# Patient Record
Sex: Female | Born: 1955 | Race: White | Hispanic: No | Marital: Married | Smoking: Never smoker
Health system: Southern US, Community
[De-identification: ages and names within clinical notes are randomized; demographics above are authoritative.]

## PROBLEM LIST (undated history)

## (undated) DIAGNOSIS — I7789 Other specified disorders of arteries and arterioles: Secondary | ICD-10-CM

## (undated) DIAGNOSIS — J45909 Unspecified asthma, uncomplicated: Secondary | ICD-10-CM

## (undated) DIAGNOSIS — IMO0002 Reserved for concepts with insufficient information to code with codable children: Secondary | ICD-10-CM

## (undated) DIAGNOSIS — I10 Essential (primary) hypertension: Secondary | ICD-10-CM

## (undated) DIAGNOSIS — Z87898 Personal history of other specified conditions: Secondary | ICD-10-CM

## (undated) DIAGNOSIS — E785 Hyperlipidemia, unspecified: Secondary | ICD-10-CM

## (undated) DIAGNOSIS — A389 Scarlet fever, uncomplicated: Secondary | ICD-10-CM

## (undated) HISTORY — DX: Unspecified asthma, uncomplicated: J45.909

## (undated) HISTORY — PX: WISDOM TOOTH EXTRACTION: SHX21

## (undated) HISTORY — PX: APPENDECTOMY: SHX54

## (undated) HISTORY — PX: COLONOSCOPY: SHX174

## (undated) HISTORY — DX: Personal history of other specified conditions: Z87.898

## (undated) HISTORY — DX: Hyperlipidemia, unspecified: E78.5

## (undated) HISTORY — PX: EYE SURGERY: SHX253

## (undated) HISTORY — DX: Essential (primary) hypertension: I10

## (undated) HISTORY — DX: Other specified disorders of arteries and arterioles: I77.89

## (undated) HISTORY — DX: Scarlet fever, uncomplicated: A38.9

## (undated) HISTORY — DX: Reserved for concepts with insufficient information to code with codable children: IMO0002

## (undated) HISTORY — PX: KNEE SURGERY: SHX244

---

## 1982-01-22 HISTORY — PX: GYNECOLOGIC CRYOSURGERY: SHX857

## 2003-01-23 HISTORY — PX: BLADDER SURGERY: SHX569

## 2012-07-01 ENCOUNTER — Telehealth: Payer: Self-pay | Admitting: Obstetrics and Gynecology

## 2012-07-01 NOTE — Telephone Encounter (Signed)
Patient needs mammograms schedule at [facility] [phone]

## 2012-07-01 NOTE — Telephone Encounter (Signed)
Left message for patient to return call concerning mammogram to be scheduled. sue

## 2012-07-02 NOTE — Telephone Encounter (Signed)
Patient states she was calling to let us know that she had scheduled her mammogram at [facility] and only needed the doctor's name who she saw last so that the report can be sent here to our office. Patient states was told they did not need an order from the doctor as long as she was not having any problems. sue

## 2012-07-02 NOTE — Telephone Encounter (Signed)
Yes patient aware of Dr. Salena Saner. Romine.

## 2012-07-02 NOTE — Telephone Encounter (Signed)
Left message on VM at [facility]/ [phone]/ of need to send fax form for referral to have patient mammogram done there per her request.

## 2012-07-02 NOTE — Telephone Encounter (Signed)
It was Dr. Tresa Res.  Did you let pt know that?

## 2012-12-09 ENCOUNTER — Ambulatory Visit: Payer: Self-pay | Admitting: Gynecology

## 2012-12-09 ENCOUNTER — Ambulatory Visit: Payer: Self-pay | Admitting: Obstetrics and Gynecology

## 2012-12-23 ENCOUNTER — Encounter: Payer: Self-pay | Admitting: Obstetrics & Gynecology

## 2012-12-25 ENCOUNTER — Encounter: Payer: Self-pay | Admitting: Obstetrics & Gynecology

## 2012-12-25 ENCOUNTER — Ambulatory Visit (INDEPENDENT_AMBULATORY_CARE_PROVIDER_SITE_OTHER): Payer: BC Managed Care – PPO | Admitting: Obstetrics & Gynecology

## 2012-12-25 VITALS — BP 130/80 | HR 72 | Resp 20 | Ht 64.75 in | Wt 134.0 lb

## 2012-12-25 DIAGNOSIS — Z01419 Encounter for gynecological examination (general) (routine) without abnormal findings: Secondary | ICD-10-CM

## 2012-12-25 DIAGNOSIS — Z124 Encounter for screening for malignant neoplasm of cervix: Secondary | ICD-10-CM

## 2012-12-25 NOTE — Patient Instructions (Signed)
Osphena
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
## 2012-12-26 ENCOUNTER — Telehealth: Payer: Self-pay | Admitting: Obstetrics & Gynecology

## 2012-12-26 NOTE — Telephone Encounter (Addendum)
I left a message for patient to call with the name and phone number of Doctor who ordered her recent labs.  I will need to get labs from the ordering Doctor. The patient had signed a medical release to [facility].

## 2012-12-30 LAB — IPS PAP TEST WITH HPV

## 2013-11-23 ENCOUNTER — Encounter: Payer: Self-pay | Admitting: Obstetrics & Gynecology

## 2014-01-26 ENCOUNTER — Encounter: Payer: Self-pay | Admitting: Obstetrics & Gynecology

## 2014-01-26 ENCOUNTER — Ambulatory Visit (INDEPENDENT_AMBULATORY_CARE_PROVIDER_SITE_OTHER): Payer: BLUE CROSS/BLUE SHIELD | Admitting: Obstetrics & Gynecology

## 2014-01-26 VITALS — BP 144/82 | HR 60 | Resp 16 | Ht 65.0 in | Wt 132.6 lb

## 2014-01-26 DIAGNOSIS — Z0000 Encounter for general adult medical examination without abnormal findings: Secondary | ICD-10-CM

## 2014-01-26 DIAGNOSIS — Z01419 Encounter for gynecological examination (general) (routine) without abnormal findings: Secondary | ICD-10-CM

## 2014-01-26 LAB — POCT URINALYSIS DIPSTICK
BILIRUBIN UA: NEGATIVE
Blood, UA: NEGATIVE
GLUCOSE UA: NEGATIVE
Ketones, UA: NEGATIVE
LEUKOCYTES UA: NEGATIVE
NITRITE UA: NEGATIVE
Protein, UA: NEGATIVE
UROBILINOGEN UA: NEGATIVE
pH, UA: 5
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
## 2015-03-22 ENCOUNTER — Ambulatory Visit: Payer: BLUE CROSS/BLUE SHIELD | Admitting: Obstetrics & Gynecology

## 2015-11-18 ENCOUNTER — Encounter: Payer: Self-pay | Admitting: Obstetrics & Gynecology

## 2015-11-18 ENCOUNTER — Ambulatory Visit (INDEPENDENT_AMBULATORY_CARE_PROVIDER_SITE_OTHER): Payer: BLUE CROSS/BLUE SHIELD | Admitting: Obstetrics & Gynecology

## 2015-11-18 VITALS — BP 110/62 | HR 72 | Resp 14 | Ht 65.0 in | Wt 143.4 lb

## 2015-11-18 DIAGNOSIS — Z01419 Encounter for gynecological examination (general) (routine) without abnormal findings: Secondary | ICD-10-CM | POA: Diagnosis not present

## 2015-11-18 DIAGNOSIS — I10 Essential (primary) hypertension: Secondary | ICD-10-CM | POA: Diagnosis not present

## 2015-11-18 DIAGNOSIS — Z205 Contact with and (suspected) exposure to viral hepatitis: Secondary | ICD-10-CM | POA: Diagnosis not present

## 2015-11-18 DIAGNOSIS — Z124 Encounter for screening for malignant neoplasm of cervix: Secondary | ICD-10-CM

## 2015-11-18 NOTE — Patient Instructions (Addendum)
Recommendations regarding Calcium intake:  1200-1500mg  daily.  Os cal or caltrate.
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
## 2015-11-18 NOTE — Addendum Note (Signed)
Addended by: Jerene BearsMILLER, Ruchama Kubicek S on: 11/18/2015 04:23 PM   Modules accepted: Orders

## 2015-11-19 LAB — HEPATITIS C ANTIBODY: HCV Ab: NEGATIVE

## 2015-11-22 LAB — IPS PAP TEST WITH HPV

## 2016-11-26 DIAGNOSIS — I10 Essential (primary) hypertension: Secondary | ICD-10-CM | POA: Diagnosis not present

## 2016-11-26 DIAGNOSIS — Z8249 Family history of ischemic heart disease and other diseases of the circulatory system: Secondary | ICD-10-CM | POA: Diagnosis not present

## 2016-11-26 DIAGNOSIS — I77819 Aortic ectasia, unspecified site: Secondary | ICD-10-CM | POA: Diagnosis not present

## 2016-11-28 DIAGNOSIS — R351 Nocturia: Secondary | ICD-10-CM | POA: Diagnosis not present

## 2016-11-28 DIAGNOSIS — R350 Frequency of micturition: Secondary | ICD-10-CM | POA: Diagnosis not present

## 2016-12-04 ENCOUNTER — Telehealth: Payer: Self-pay | Admitting: Obstetrics & Gynecology

## 2016-12-04 NOTE — Telephone Encounter (Signed)
Return call to patient. States she had appointment with Dr Sherron MondayMacDiarmid and needs surgery for bladder prolapse. Hysterectomy recommended at same time. Was told may be able to be done soon. Advised will first need office visit with Dr Hyacinth MeekerMiller for evaluation. Advised dates are limited and scheduling does take time.   Appointment with Dr Hyacinth MeekerMiller scheduled for 12-07-16 at 1pm. Patient last seen for annual 11-18-15.   Routing to provider for final review. Patient agreeable to disposition. Will close encounter.

## 2016-12-04 NOTE — Telephone Encounter (Signed)
Patient saw her urologist and he is recommending she speak to Dr Hyacinth MeekerMiller about having a hysterectomy at the same time that he does a bladder procedure.

## 2016-12-07 ENCOUNTER — Encounter: Payer: Self-pay | Admitting: Obstetrics & Gynecology

## 2016-12-07 ENCOUNTER — Ambulatory Visit (INDEPENDENT_AMBULATORY_CARE_PROVIDER_SITE_OTHER): Payer: BLUE CROSS/BLUE SHIELD | Admitting: Obstetrics & Gynecology

## 2016-12-07 ENCOUNTER — Other Ambulatory Visit: Payer: Self-pay

## 2016-12-07 VITALS — BP 140/60 | HR 68 | Resp 16 | Ht 65.0 in | Wt 141.0 lb

## 2016-12-07 DIAGNOSIS — N8111 Cystocele, midline: Secondary | ICD-10-CM | POA: Diagnosis not present

## 2016-12-07 DIAGNOSIS — N812 Incomplete uterovaginal prolapse: Secondary | ICD-10-CM
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
## 2016-12-19 ENCOUNTER — Telehealth: Payer: Self-pay | Admitting: *Deleted

## 2016-12-19 NOTE — Telephone Encounter (Signed)
Patient returning your call.

## 2016-12-19 NOTE — Telephone Encounter (Signed)
Call to patient. Voice mail confirms "Jeanne Livingston." Left message to call back. Call to advise patient that Dr Hyacinth MeekerMiller has reviewed office visit with Dr Sherron MondayMacDiarmid. No available dates in December. Can begin scheduling for 2019 when she is ready.

## 2016-12-19 NOTE — Telephone Encounter (Signed)
Return call to patient. Advised of date updates from Dr Hyacinth MeekerMiller. Patient requests to know what dates in 2019 are available and then she will decide. Will confirm with Dr Sherron MondayMacDiarmid and call her back.

## 2016-12-20 NOTE — Telephone Encounter (Signed)
Call to patient. Advised surgery dates with Dr MacDiarmid's office are 02-12-17, 03-05-17 and 03-19-17. Patient will check these dates and call me back.

## 2016-12-21 NOTE — Telephone Encounter (Signed)
Current encounter closed. Will await patient call back with date preferences.  Routing to provider for final review. Patient agreeable to disposition. Will close encounter.

## 2016-12-24 ENCOUNTER — Telehealth: Payer: Self-pay | Admitting: Obstetrics & Gynecology

## 2016-12-24 NOTE — Telephone Encounter (Signed)
Return call to patient. She requests to proceed with surgery date of 03-05-17. Advised will proceed with scheduling and coordinate with Dr Sherron MondayMacDiarmid. Advised patient she will need to schudele her urodynamic testing with his office. Patient has additional questions regarding ability to void prior to discharge since she lives in [place]. Advised that she will be scheduled for out-patient with extended recovery and if additional time is needed that will be determined at time of hospitalization. Advised Dr Sherron MondayMacDiarmid may have more thoughts on this.

## 2016-12-24 NOTE — Telephone Encounter (Signed)
Patient returning Jeanne Livingston's call to discuss surgery dates.

## 2017-01-03 NOTE — Telephone Encounter (Signed)
Patient returned call. Surgery information sheet reviewed with patient and she verbalized understanding. Pre and post operative appointments scheduled and patient agreeable to date and time of all appointments. Patient aware a copy of surgery information sheet will be mailed to her. Patient verified "temporary address" on file is where information needed to be mailed.   Routing to provider for final review. Patient agreeable to disposition. Will close encounter.

## 2017-01-03 NOTE — Telephone Encounter (Signed)
Detailed message left per DPR for patient to return call to Jeanne BurtonEmily or Kennon RoundsSally to review Surgery Information sheet, and to schedule pre and post op appointments.

## 2017-01-09 ENCOUNTER — Other Ambulatory Visit: Payer: Self-pay | Admitting: Obstetrics & Gynecology

## 2017-02-06 DIAGNOSIS — H2513 Age-related nuclear cataract, bilateral: Secondary | ICD-10-CM | POA: Diagnosis not present

## 2017-02-06 DIAGNOSIS — H524 Presbyopia: Secondary | ICD-10-CM | POA: Diagnosis not present

## 2017-02-06 DIAGNOSIS — H43393 Other vitreous opacities, bilateral: Secondary | ICD-10-CM | POA: Diagnosis not present

## 2017-02-13 DIAGNOSIS — R351 Nocturia: Secondary | ICD-10-CM | POA: Diagnosis not present

## 2017-02-13 DIAGNOSIS — R350 Frequency of micturition: Secondary | ICD-10-CM | POA: Diagnosis not present

## 2017-02-19 NOTE — Patient Instructions (Addendum)
Your procedure is scheduled on:  Tuesday, Feb 12  [facility]  6 am  Pick up the phone at the desk and dial [phone].  Call this number if you have problems the morning of surgery: [phone].  Remember: Do NOT eat or Do NOT drink clear liquids (including water) after midnight Monday  Take these medicines the morning of surgery with a SIP OF WATER:  None  Stop herbal medications and supplements at this time.  Do NOT wear jewelry (body piercing), metal hair clips/bobby pins, make-up, or nail polish. Do NOT wear lotions, powders, or perfumes.  You may wear deoderant. Do NOT shave for 48 hours prior to surgery. Do NOT bring valuables to the hospital.  Leave suitcase in car.  After surgery it may be brought to your room.  For patients admitted to the hospital, checkout time is 11:00 AM the day of discharge. Have a responsible adult drive you home and stay with you for 24 hours after your procedure.  Home with husband "Nadine CountsBob" cell [phone]

## 2017-02-21 ENCOUNTER — Other Ambulatory Visit: Payer: Self-pay | Admitting: Urology
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
## 2017-02-22 ENCOUNTER — Other Ambulatory Visit: Payer: Self-pay

## 2017-02-22 ENCOUNTER — Ambulatory Visit (INDEPENDENT_AMBULATORY_CARE_PROVIDER_SITE_OTHER): Payer: BLUE CROSS/BLUE SHIELD | Admitting: Obstetrics & Gynecology

## 2017-02-22 ENCOUNTER — Encounter (HOSPITAL_COMMUNITY)
Admission: RE | Admit: 2017-02-22 | Discharge: 2017-02-22 | Disposition: A | Discharge status: Home / Self Care | Payer: BLUE CROSS/BLUE SHIELD | Source: Ambulatory Visit | Attending: Obstetrics & Gynecology | Admitting: Obstetrics & Gynecology

## 2017-02-22 ENCOUNTER — Encounter: Payer: Self-pay | Admitting: Obstetrics & Gynecology

## 2017-02-22 ENCOUNTER — Encounter (HOSPITAL_COMMUNITY): Payer: Self-pay

## 2017-02-22 VITALS — BP 142/64 | HR 64 | Resp 12 | Ht 64.5 in | Wt 143.0 lb

## 2017-02-22 DIAGNOSIS — Z9889 Other specified postprocedural states: Secondary | ICD-10-CM | POA: Diagnosis not present

## 2017-02-22 DIAGNOSIS — J45990 Exercise induced bronchospasm: Secondary | ICD-10-CM | POA: Diagnosis not present

## 2017-02-22 DIAGNOSIS — N812 Incomplete uterovaginal prolapse: Secondary | ICD-10-CM

## 2017-02-22 DIAGNOSIS — E785 Hyperlipidemia, unspecified: Secondary | ICD-10-CM | POA: Insufficient documentation

## 2017-02-22 DIAGNOSIS — Z79899 Other long term (current) drug therapy: Secondary | ICD-10-CM | POA: Insufficient documentation

## 2017-02-22 DIAGNOSIS — I10 Essential (primary) hypertension: Secondary | ICD-10-CM | POA: Insufficient documentation

## 2017-02-22 DIAGNOSIS — N959 Unspecified menopausal and perimenopausal disorder: Secondary | ICD-10-CM | POA: Diagnosis not present

## 2017-02-22 DIAGNOSIS — Z01812 Encounter for preprocedural laboratory examination: Secondary | ICD-10-CM | POA: Insufficient documentation

## 2017-02-22 DIAGNOSIS — Z791 Long term (current) use of non-steroidal anti-inflammatories (NSAID): Secondary | ICD-10-CM | POA: Insufficient documentation

## 2017-02-22 DIAGNOSIS — I7789 Other specified disorders of arteries and arterioles: Secondary | ICD-10-CM | POA: Diagnosis not present

## 2017-02-22 DIAGNOSIS — N8111 Cystocele, midline: Secondary | ICD-10-CM | POA: Diagnosis not present

## 2017-02-22 LAB — ABO/RH: ABO/RH(D): A POS

## 2017-02-22 LAB — TYPE AND SCREEN
ABO/RH(D): A POS
ANTIBODY SCREEN: NEGATIVE

## 2017-02-22 LAB — CBC
HEMATOCRIT: 39.5 % (ref 36.0–46.0)
HEMOGLOBIN: 13.3 g/dL (ref 12.0–15.0)
MCH: 31.9 pg (ref 26.0–34.0)
MCHC: 33.7 g/dL (ref 30.0–36.0)
MCV: 94.7 fL (ref 78.0–100.0)
Platelets: 255 10*3/uL (ref 150–400)
RBC: 4.17 MIL/uL (ref 3.87–5.11)
RDW: 12.5 % (ref 11.5–15.5)
WBC: 6 10*3/uL (ref 4.0–10.5)

## 2017-02-22 LAB — APTT: aPTT: 28 seconds (ref 24–36)

## 2017-02-22 LAB — PROTIME-INR
INR: 1.09
PROTHROMBIN TIME: 14 s (ref 11.4–15.2)

## 2017-02-22 LAB — BASIC METABOLIC PANEL
ANION GAP: 9 (ref 5–15)
BUN: 18 mg/dL (ref 6–20)
CHLORIDE: 104 mmol/L (ref 101–111)
CO2: 24 mmol/L (ref 22–32)
Calcium: 9.1 mg/dL (ref 8.9–10.3)
Creatinine, Ser: 0.77 mg/dL (ref 0.44–1.00)
GFR calc Af Amer: >60 mL/min (ref >60)
Glucose, Bld: 117 mg/dL — ABNORMAL HIGH (ref 65–99)
POTASSIUM: 3.8 mmol/L (ref 3.5–5.1)
SODIUM: 137 mmol/L (ref 135–145)

## 2017-02-26 ENCOUNTER — Telehealth: Payer: Self-pay | Admitting: *Deleted

## 2017-02-26 NOTE — Telephone Encounter (Signed)
Delray AltMargie is returning your call.

## 2017-02-26 NOTE — Telephone Encounter (Signed)
[facility].Left message to call back.

## 2017-02-26 NOTE — Telephone Encounter (Signed)
Call to Dr Okey Dupreose office [facility]) Left message with Darius advising Dr Okey Dupreose of upcoming surgical procedure and requesting any additional suggestions for patient management from Dr Okey Dupreose.  Left message to call back.

## 2017-02-26 NOTE — Telephone Encounter (Signed)
[facility] is calling to see if you are calling regarding cardiac clearness. Please return call to [phone]

## 2017-02-27 NOTE — Telephone Encounter (Signed)
[facility] at [facility]key Dupreose office. Left message to return call. Can speak to any triage nurse.

## 2017-02-27 NOTE — Telephone Encounter (Signed)
[facility]ose [facility]. She will review with Dr Okey Dupreose and fax any additional recommendations to office. Fax number confirmed.   Routing to provider for final review. Patient agreeable to disposition. Will close encounter.

## 2017-02-28 DIAGNOSIS — Z1231 Encounter for screening mammogram for malignant neoplasm of breast: Secondary | ICD-10-CM | POA: Diagnosis not present

## 2017-03-01 ENCOUNTER — Telehealth: Payer: Self-pay

## 2017-03-01 NOTE — Telephone Encounter (Signed)
Spoke with nurse French Anaracy with Dr.Rose who states she has spoken with Dr.Rose about the patient's upcoming surgery. Dr.Rose states that no additional monitoring or testing needs to be done before, during, or after surgery for this patient.  Routing to provider for final review. Patient agreeable to disposition. Will close encounter.

## 2017-03-05 ENCOUNTER — Encounter (HOSPITAL_COMMUNITY): Payer: Self-pay | Admitting: *Deleted

## 2017-03-05 ENCOUNTER — Ambulatory Visit (HOSPITAL_COMMUNITY): Payer: BLUE CROSS/BLUE SHIELD | Admitting: Anesthesiology

## 2017-03-05 ENCOUNTER — Other Ambulatory Visit: Payer: Self-pay

## 2017-03-05 ENCOUNTER — Ambulatory Visit (HOSPITAL_COMMUNITY)
Admission: AD | Admit: 2017-03-05 | Discharge: 2017-03-06 | Disposition: A | Discharge status: Home / Self Care | Payer: BLUE CROSS/BLUE SHIELD | Source: Ambulatory Visit | Attending: Obstetrics & Gynecology | Admitting: Obstetrics & Gynecology

## 2017-03-05 ENCOUNTER — Encounter (HOSPITAL_COMMUNITY)
Admission: AD | Disposition: A | Discharge status: Home / Self Care | Payer: Self-pay | Source: Ambulatory Visit | Attending: Obstetrics & Gynecology

## 2017-03-05 DIAGNOSIS — N888 Other specified noninflammatory disorders of cervix uteri: Secondary | ICD-10-CM | POA: Diagnosis not present

## 2017-03-05 DIAGNOSIS — J45990 Exercise induced bronchospasm: Secondary | ICD-10-CM | POA: Insufficient documentation

## 2017-03-05 DIAGNOSIS — N813 Complete uterovaginal prolapse: Secondary | ICD-10-CM | POA: Insufficient documentation

## 2017-03-05 DIAGNOSIS — Z8679 Personal history of other diseases of the circulatory system: Secondary | ICD-10-CM | POA: Diagnosis not present

## 2017-03-05 DIAGNOSIS — N816 Rectocele: Secondary | ICD-10-CM | POA: Insufficient documentation

## 2017-03-05 DIAGNOSIS — Z91038 Other insect allergy status: Secondary | ICD-10-CM | POA: Insufficient documentation

## 2017-03-05 DIAGNOSIS — Z79899 Other long term (current) drug therapy: Secondary | ICD-10-CM | POA: Insufficient documentation

## 2017-03-05 DIAGNOSIS — Z885 Allergy status to narcotic agent status: Secondary | ICD-10-CM | POA: Diagnosis not present

## 2017-03-05 DIAGNOSIS — Z882 Allergy status to sulfonamides status: Secondary | ICD-10-CM | POA: Insufficient documentation

## 2017-03-05 DIAGNOSIS — N8110 Cystocele, unspecified: Secondary | ICD-10-CM | POA: Diagnosis not present

## 2017-03-05 DIAGNOSIS — N812 Incomplete uterovaginal prolapse: Secondary | ICD-10-CM | POA: Diagnosis not present

## 2017-03-05 DIAGNOSIS — N8111 Cystocele, midline: Secondary | ICD-10-CM | POA: Diagnosis not present

## 2017-03-05 DIAGNOSIS — E785 Hyperlipidemia, unspecified: Secondary | ICD-10-CM | POA: Diagnosis not present

## 2017-03-05 DIAGNOSIS — I10 Essential (primary) hypertension: Secondary | ICD-10-CM | POA: Insufficient documentation

## 2017-03-05 DIAGNOSIS — Z91030 Bee allergy status: Secondary | ICD-10-CM | POA: Diagnosis not present

## 2017-03-05 HISTORY — PX: TOTAL LAPAROSCOPIC HYSTERECTOMY WITH SALPINGECTOMY: SHX6742

## 2017-03-05 HISTORY — PX: CYSTOSCOPY: SHX5120

## 2017-03-05 HISTORY — PX: CYSTOCELE REPAIR: SHX163

## 2017-03-05 LAB — PROTIME-INR
INR: 0.99
PROTHROMBIN TIME: 13 s (ref 11.4–15.2)

## 2017-03-05 SURGERY — HYSTERECTOMY, TOTAL, LAPAROSCOPIC, WITH SALPINGECTOMY
Anesthesia: General | Site: Vagina

## 2017-03-05 MED ORDER — DEXAMETHASONE SODIUM PHOSPHATE 4 MG/ML IJ SOLN
INTRAMUSCULAR | Status: AC
  Filled 2017-03-05: qty 1

## 2017-03-05 MED ORDER — PHENAZOPYRIDINE HCL 200 MG PO TABS
200.0000 mg | ORAL_TABLET | Freq: Once | ORAL | Status: AC
  Administered 2017-03-05: 200 mg via ORAL
  Filled 2017-03-05: qty 1

## 2017-03-05 MED ORDER — MIDAZOLAM HCL 2 MG/2ML IJ SOLN
INTRAMUSCULAR | Status: DC | PRN
  Administered 2017-03-05: 2 mg via INTRAVENOUS

## 2017-03-05 MED ORDER — LIDOCAINE-EPINEPHRINE 1 %-1:200000 IJ SOLN
INTRAMUSCULAR | Status: AC
Start: 2017-03-05 — End: ?
  Filled 2017-03-05: qty 30

## 2017-03-05 MED ORDER — ESTRADIOL 0.1 MG/GM VA CREA
TOPICAL_CREAM | VAGINAL | Status: DC | PRN
  Administered 2017-03-05: 1 via VAGINAL

## 2017-03-05 MED ORDER — ACETAMINOPHEN 325 MG PO TABS: 650.0000 mg | ORAL_TABLET | ORAL | Status: DC | PRN

## 2017-03-05 MED ORDER — ACETAMINOPHEN 500 MG PO TABS
ORAL_TABLET | ORAL | Status: AC
  Filled 2017-03-05: qty 2

## 2017-03-05 MED ORDER — LACTATED RINGERS IV SOLN
INTRAVENOUS | Status: DC
  Administered 2017-03-05 (×3): via INTRAVENOUS

## 2017-03-05 MED ORDER — NEOSTIGMINE METHYLSULFATE 10 MG/10ML IV SOLN
INTRAVENOUS | Status: AC
  Filled 2017-03-05: qty 1

## 2017-03-05 MED ORDER — ONDANSETRON HCL 4 MG/2ML IJ SOLN
INTRAMUSCULAR | Status: AC
  Filled 2017-03-05: qty 2

## 2017-03-05 MED ORDER — SUGAMMADEX SODIUM 200 MG/2ML IV SOLN
INTRAVENOUS | Status: AC
  Filled 2017-03-05: qty 2

## 2017-03-05 MED ORDER — KETOROLAC TROMETHAMINE 30 MG/ML IJ SOLN
INTRAMUSCULAR | Status: AC
  Filled 2017-03-05: qty 1

## 2017-03-05 MED ORDER — ROPIVACAINE HCL 5 MG/ML IJ SOLN
INTRAMUSCULAR | Status: AC
  Filled 2017-03-05: qty 30

## 2017-03-05 MED ORDER — LIDOCAINE HCL (CARDIAC) 20 MG/ML IV SOLN
INTRAVENOUS | Status: AC
  Filled 2017-03-05: qty 5

## 2017-03-05 MED ORDER — ALUM & MAG HYDROXIDE-SIMETH 200-200-20 MG/5ML PO SUSP: 30.0000 mL | ORAL | Status: DC | PRN

## 2017-03-05 MED ORDER — SODIUM CHLORIDE 0.9 % IJ SOLN
INTRAMUSCULAR | Status: AC
  Filled 2017-03-05: qty 100

## 2017-03-05 MED ORDER — CEFOTETAN DISODIUM-DEXTROSE 2-2.08 GM-%(50ML) IV SOLR
2.0000 g | INTRAVENOUS | Status: AC
  Administered 2017-03-05: 2 g via INTRAVENOUS

## 2017-03-05 MED ORDER — DEXAMETHASONE SODIUM PHOSPHATE 4 MG/ML IJ SOLN
INTRAMUSCULAR | Status: DC | PRN
  Administered 2017-03-05: 4 mg via INTRAVENOUS

## 2017-03-05 MED ORDER — KETOROLAC TROMETHAMINE 30 MG/ML IJ SOLN
INTRAMUSCULAR | Status: DC | PRN
  Administered 2017-03-05: 30 mg via INTRAVENOUS

## 2017-03-05 MED ORDER — GLYCOPYRROLATE 0.2 MG/ML IJ SOLN
INTRAMUSCULAR | Status: DC | PRN
  Administered 2017-03-05: 0.2 mg via INTRAVENOUS

## 2017-03-05 MED ORDER — SIMETHICONE 80 MG PO CHEW: 80.0000 mg | CHEWABLE_TABLET | Freq: Four times a day (QID) | ORAL | Status: DC | PRN

## 2017-03-05 MED ORDER — FENTANYL CITRATE (PF) 100 MCG/2ML IJ SOLN
INTRAMUSCULAR | Status: DC | PRN
  Administered 2017-03-05: 150 ug via INTRAVENOUS
  Administered 2017-03-05 (×2): 50 ug via INTRAVENOUS

## 2017-03-05 MED ORDER — NEOSTIGMINE METHYLSULFATE 10 MG/10ML IV SOLN
INTRAVENOUS | Status: DC | PRN
  Administered 2017-03-05: 2 mg via INTRAVENOUS

## 2017-03-05 MED ORDER — STERILE WATER FOR IRRIGATION IR SOLN
Status: DC | PRN
  Administered 2017-03-05: 1000 mL via INTRAVESICAL

## 2017-03-05 MED ORDER — LIDOCAINE-EPINEPHRINE 1 %-1:100000 IJ SOLN
INTRAMUSCULAR | Status: AC
  Filled 2017-03-05: qty 1

## 2017-03-05 MED ORDER — ACETAMINOPHEN 500 MG PO TABS
1000.0000 mg | ORAL_TABLET | Freq: Once | ORAL | Status: AC
  Administered 2017-03-05: 1000 mg via ORAL

## 2017-03-05 MED ORDER — FENTANYL CITRATE (PF) 100 MCG/2ML IJ SOLN: 25.0000 ug | INTRAMUSCULAR | Status: DC | PRN

## 2017-03-05 MED ORDER — MORPHINE SULFATE (PF) 4 MG/ML IV SOLN: 1.0000 mg | INTRAVENOUS | Status: DC | PRN

## 2017-03-05 MED ORDER — ROCURONIUM BROMIDE 100 MG/10ML IV SOLN
INTRAVENOUS | Status: AC
  Filled 2017-03-05: qty 1

## 2017-03-05 MED ORDER — MIDAZOLAM HCL 2 MG/2ML IJ SOLN
INTRAMUSCULAR | Status: AC
  Filled 2017-03-05: qty 2

## 2017-03-05 MED ORDER — PHENAZOPYRIDINE HCL 200 MG PO TABS
200.0000 mg | ORAL_TABLET | Freq: Once | ORAL | Status: DC
  Filled 2017-03-05: qty 1

## 2017-03-05 MED ORDER — SODIUM CHLORIDE 0.9 % IJ SOLN
INTRAMUSCULAR | Status: AC
  Filled 2017-03-05: qty 10

## 2017-03-05 MED ORDER — LIDOCAINE-EPINEPHRINE 1 %-1:100000 IJ SOLN
INTRAMUSCULAR | Status: DC | PRN
  Administered 2017-03-05: 20 mL

## 2017-03-05 MED ORDER — CEFOTETAN DISODIUM-DEXTROSE 2-2.08 GM-%(50ML) IV SOLR
INTRAVENOUS | Status: AC
  Filled 2017-03-05: qty 50

## 2017-03-05 MED ORDER — PANTOPRAZOLE SODIUM 40 MG IV SOLR
40.0000 mg | Freq: Every day | INTRAVENOUS | Status: DC
  Filled 2017-03-05 (×2): qty 40

## 2017-03-05 MED ORDER — KETOROLAC TROMETHAMINE 30 MG/ML IJ SOLN
30.0000 mg | Freq: Four times a day (QID) | INTRAMUSCULAR | Status: DC
  Administered 2017-03-05 – 2017-03-06 (×3): 30 mg via INTRAVENOUS
  Filled 2017-03-05 (×2): qty 1

## 2017-03-05 MED ORDER — HYDROMORPHONE HCL 1 MG/ML IJ SOLN
INTRAMUSCULAR | Status: AC
  Filled 2017-03-05: qty 1

## 2017-03-05 MED ORDER — MENTHOL 3 MG MT LOZG: 1.0000 | LOZENGE | OROMUCOSAL | Status: DC | PRN

## 2017-03-05 MED ORDER — ONDANSETRON HCL 4 MG/2ML IJ SOLN: 4.0000 mg | Freq: Once | INTRAMUSCULAR | Status: DC | PRN

## 2017-03-05 MED ORDER — PROPOFOL 10 MG/ML IV BOLUS
INTRAVENOUS | Status: AC
  Filled 2017-03-05: qty 20

## 2017-03-05 MED ORDER — DEXTROSE-NACL 5-0.45 % IV SOLN
INTRAVENOUS | Status: DC
  Administered 2017-03-05 – 2017-03-06 (×2): via INTRAVENOUS

## 2017-03-05 MED ORDER — EPHEDRINE SULFATE 50 MG/ML IJ SOLN
INTRAMUSCULAR | Status: DC | PRN
  Administered 2017-03-05 (×4): 5 mg via INTRAVENOUS

## 2017-03-05 MED ORDER — FENTANYL CITRATE (PF) 250 MCG/5ML IJ SOLN
INTRAMUSCULAR | Status: AC
  Filled 2017-03-05: qty 5

## 2017-03-05 MED ORDER — ESTRADIOL 0.1 MG/GM VA CREA
TOPICAL_CREAM | VAGINAL | Status: AC
  Filled 2017-03-05: qty 42.5

## 2017-03-05 MED ORDER — SCOPOLAMINE 1 MG/3DAYS TD PT72
1.0000 | MEDICATED_PATCH | Freq: Once | TRANSDERMAL | Status: DC
  Administered 2017-03-05: 1.5 mg via TRANSDERMAL

## 2017-03-05 MED ORDER — BUPIVACAINE HCL (PF) 0.25 % IJ SOLN
INTRAMUSCULAR | Status: AC
  Filled 2017-03-05: qty 30

## 2017-03-05 MED ORDER — SCOPOLAMINE 1 MG/3DAYS TD PT72
MEDICATED_PATCH | TRANSDERMAL | Status: AC
  Filled 2017-03-05: qty 1

## 2017-03-05 MED ORDER — GLYCOPYRROLATE 0.2 MG/ML IJ SOLN
INTRAMUSCULAR | Status: AC
  Filled 2017-03-05: qty 1

## 2017-03-05 MED ORDER — KETOROLAC TROMETHAMINE 30 MG/ML IJ SOLN
30.0000 mg | Freq: Four times a day (QID) | INTRAMUSCULAR | Status: DC
  Filled 2017-03-05: qty 1

## 2017-03-05 MED ORDER — LIDOCAINE HCL (CARDIAC) 20 MG/ML IV SOLN
INTRAVENOUS | Status: DC | PRN
  Administered 2017-03-05: 80 mg via INTRAVENOUS

## 2017-03-05 MED ORDER — EPHEDRINE 5 MG/ML INJ
INTRAVENOUS | Status: AC
  Filled 2017-03-05: qty 10

## 2017-03-05 MED ORDER — LOSARTAN POTASSIUM 50 MG PO TABS
50.0000 mg | ORAL_TABLET | Freq: Every day | ORAL | Status: DC
  Administered 2017-03-05: 50 mg via ORAL
  Filled 2017-03-05: qty 1

## 2017-03-05 MED ORDER — ROCURONIUM BROMIDE 100 MG/10ML IV SOLN
INTRAVENOUS | Status: DC | PRN
  Administered 2017-03-05: 60 mg via INTRAVENOUS

## 2017-03-05 MED ORDER — SODIUM CHLORIDE 0.9 % IV SOLN
INTRAVENOUS | Status: DC | PRN
  Administered 2017-03-05: 60 mL

## 2017-03-05 MED ORDER — ONDANSETRON HCL 4 MG/2ML IJ SOLN
INTRAMUSCULAR | Status: DC | PRN
  Administered 2017-03-05: 4 mg via INTRAVENOUS

## 2017-03-05 MED ORDER — PROPOFOL 10 MG/ML IV BOLUS
INTRAVENOUS | Status: DC | PRN
  Administered 2017-03-05: 170 mg via INTRAVENOUS

## 2017-03-05 MED ORDER — GENTAMICIN SULFATE 40 MG/ML IJ SOLN
5.0000 mg/kg | Freq: Once | INTRAVENOUS | Status: AC
  Administered 2017-03-05: 300 mg via INTRAVENOUS
  Filled 2017-03-05: qty 7.5

## 2017-03-05 SURGICAL SUPPLY — 101 items
APPLICATOR ARISTA FLEXITIP XL (MISCELLANEOUS) IMPLANT
BAG URINE DRAINAGE (UROLOGICAL SUPPLIES) ×7 IMPLANT
BLADE SURG 15 STRL LF C SS BP (BLADE) ×5 IMPLANT
BLADE SURG 15 STRL SS (BLADE) ×2
CABLE HIGH FREQUENCY MONO STRZ (ELECTRODE) ×7 IMPLANT
CANISTER SUCT 3000ML PPV (MISCELLANEOUS) ×7 IMPLANT
CATH FOLEY 2WAY SLVR  5CC 16FR (CATHETERS)
CATH FOLEY 2WAY SLVR 5CC 16FR (CATHETERS) IMPLANT
CATH ROBINSON RED A/P 16FR (CATHETERS) IMPLANT
CONT PATH 16OZ SNAP LID 3702 (MISCELLANEOUS) ×7 IMPLANT
COVER BACK TABLE 60X90IN (DRAPES) ×7 IMPLANT
COVER MAYO STAND STRL (DRAPES) ×7 IMPLANT
DECANTER SPIKE VIAL GLASS SM (MISCELLANEOUS) ×14 IMPLANT
DERMABOND ADHESIVE PROPEN (GAUZE/BANDAGES/DRESSINGS) ×2
DERMABOND ADVANCED (GAUZE/BANDAGES/DRESSINGS) ×4
DERMABOND ADVANCED .7 DNX12 (GAUZE/BANDAGES/DRESSINGS) ×10 IMPLANT
DERMABOND ADVANCED .7 DNX6 (GAUZE/BANDAGES/DRESSINGS) ×5 IMPLANT
DEVICE CAPIO SLIM SINGLE (INSTRUMENTS) IMPLANT
DILATOR CANAL MILEX (MISCELLANEOUS) ×7 IMPLANT
DRAIN PENROSE 1/4X12 LTX (DRAIN) ×7 IMPLANT
DRAPE UNDERBUTTOCKS STRL (DRAPE) ×7 IMPLANT
DRSG OPSITE POSTOP 3X4 (GAUZE/BANDAGES/DRESSINGS) IMPLANT
DURAPREP 26ML APPLICATOR (WOUND CARE) ×7 IMPLANT
ELECT REM PT RETURN 9FT ADLT (ELECTROSURGICAL) ×7
ELECTRODE REM PT RTRN 9FT ADLT (ELECTROSURGICAL) ×5 IMPLANT
FILTER SMOKE EVAC LAPAROSHD (FILTER) ×7 IMPLANT
FORCEPS CUTTING 33CM 5MM (CUTTING FORCEPS) IMPLANT
GAUZE PACKING 2X5 YD STRL (GAUZE/BANDAGES/DRESSINGS) ×7 IMPLANT
GAUZE SPONGE 4X4 16PLY XRAY LF (GAUZE/BANDAGES/DRESSINGS) ×7 IMPLANT
GLOVE BIO SURGEON STRL SZ7.5 (GLOVE) ×7 IMPLANT
GLOVE BIOGEL PI IND STRL 7.0 (GLOVE) ×35 IMPLANT
GLOVE BIOGEL PI INDICATOR 7.0 (GLOVE) ×14
GLOVE ECLIPSE 6.5 STRL STRAW (GLOVE) ×14 IMPLANT
GYRUS RUMI II 2.5CM BLUE (DISPOSABLE) ×7
HEMOSTAT ARISTA ABSORB 3G PWDR (MISCELLANEOUS) IMPLANT
LEGGING LITHOTOMY PAIR STRL (DRAPES) ×7 IMPLANT
LIGASURE VESSEL 5MM BLUNT TIP (ELECTROSURGICAL) ×7 IMPLANT
NEEDLE HYPO 22GX1.5 SAFETY (NEEDLE) ×7 IMPLANT
NEEDLE INSUFFLATION 120MM (ENDOMECHANICALS) ×14 IMPLANT
NEEDLE SPNL 22GX1.5 QUINCKE BK (NEEDLE) IMPLANT
NS IRRIG 1000ML POUR BTL (IV SOLUTION) ×21 IMPLANT
OCCLUDER COLPOPNEUMO (BALLOONS) ×7 IMPLANT
PACK LAVH (CUSTOM PROCEDURE TRAY) ×7 IMPLANT
PACK ROBOTIC GOWN (GOWN DISPOSABLE) ×7 IMPLANT
PACK TRENDGUARD 450 HYBRID PRO (MISCELLANEOUS) ×5 IMPLANT
PACK TRENDGUARD 600 HYBRD PROC (MISCELLANEOUS) IMPLANT
PACK VAGINAL WOMENS (CUSTOM PROCEDURE TRAY) IMPLANT
PENCIL BUTTON HOLSTER BLD 10FT (ELECTRODE) IMPLANT
PLUG CATH AND CAP STER (CATHETERS) ×7 IMPLANT
PORT ACCESS TROCAR AIRSEAL 5 (TROCAR) ×7 IMPLANT
POUCH LAPAROSCOPIC INSTRUMENT (MISCELLANEOUS) ×7 IMPLANT
PROTECTOR NERVE ULNAR (MISCELLANEOUS) ×14 IMPLANT
RETRACTOR STAY HOOK 5MM (MISCELLANEOUS) ×14 IMPLANT
RUMI II GYRUS 2.5CM BLUE (DISPOSABLE) ×5 IMPLANT
SCISSORS LAP 5X35 DISP (ENDOMECHANICALS) ×7 IMPLANT
SEALER TISSUE G2 CVD JAW 35 (ENDOMECHANICALS) IMPLANT
SEALER TISSUE G2 CVD JAW 45CM (ENDOMECHANICALS)
SET CYSTO W/LG BORE CLAMP LF (SET/KITS/TRAYS/PACK) ×14 IMPLANT
SET IRRIG TUBING LAPAROSCOPIC (IRRIGATION / IRRIGATOR) IMPLANT
SET TRI-LUMEN FLTR TB AIRSEAL (TUBING) ×7 IMPLANT
SHEARS HARMONIC ACE PLUS 36CM (ENDOMECHANICALS) ×7 IMPLANT
SHEET LAVH (DRAPES) IMPLANT
SLEEVE XCEL OPT CAN 5 100 (ENDOMECHANICALS) ×7 IMPLANT
SOLUTION ELECTROLUBE (MISCELLANEOUS) IMPLANT
SUT CAPIO ETHIBPND (SUTURE) IMPLANT
SUT SILK 2 0 PERMA HAND 18 BK (SUTURE) IMPLANT
SUT SILK 2 0 SH (SUTURE) ×7 IMPLANT
SUT VIC AB 0 CT1 18XCR BRD8 (SUTURE) IMPLANT
SUT VIC AB 0 CT1 27 (SUTURE) ×4
SUT VIC AB 0 CT1 27XBRD ANBCTR (SUTURE) ×10 IMPLANT
SUT VIC AB 0 CT1 36 (SUTURE) ×7 IMPLANT
SUT VIC AB 0 CT1 8-18 (SUTURE)
SUT VIC AB 0 CT2 27 (SUTURE) IMPLANT
SUT VIC AB 2-0 CT1 (SUTURE) IMPLANT
SUT VIC AB 2-0 CT1 27 (SUTURE)
SUT VIC AB 2-0 CT1 TAPERPNT 27 (SUTURE) IMPLANT
SUT VIC AB 2-0 SH 27 (SUTURE) ×8
SUT VIC AB 2-0 SH 27XBRD (SUTURE) ×20 IMPLANT
SUT VIC AB 3-0 PS2 18 (SUTURE) ×9 IMPLANT
SUT VIC AB 3-0 PS2 18XBRD (SUTURE) ×5 IMPLANT
SUT VICRYL 0 TIES 12 18 (SUTURE) IMPLANT
SUT VICRYL 0 UR6 27IN ABS (SUTURE) IMPLANT
SYR 10ML LL (SYRINGE) IMPLANT
SYR 50ML LL SCALE MARK (SYRINGE) ×7 IMPLANT
SYR BULB IRRIGATION 50ML (SYRINGE) ×14 IMPLANT
TIP UTERINE 5.1X6CM LAV DISP (MISCELLANEOUS) ×7 IMPLANT
TIP UTERINE 6.7X10CM GRN DISP (MISCELLANEOUS) IMPLANT
TIP UTERINE 6.7X6CM WHT DISP (MISCELLANEOUS) IMPLANT
TIP UTERINE 6.7X8CM BLUE DISP (MISCELLANEOUS) IMPLANT
TOWEL OR 17X24 6PK STRL BLUE (TOWEL DISPOSABLE) ×28 IMPLANT
TRAY FOLEY CATH SILVER 14FR (SET/KITS/TRAYS/PACK) ×14 IMPLANT
TRENDGUARD 450 HYBRID PRO PACK (MISCELLANEOUS) ×7
TRENDGUARD 600 HYBRID PROC PK (MISCELLANEOUS)
TROCAR ADV FIXATION 5X100MM (TROCAR) ×7 IMPLANT
TROCAR BALLN 12MMX100 BLUNT (TROCAR) IMPLANT
TROCAR XCEL NON BLADE 8MM B8LT (ENDOMECHANICALS) ×7 IMPLANT
TROCAR XCEL NON-BLD 11X100MML (ENDOMECHANICALS) ×7 IMPLANT
TROCAR XCEL NON-BLD 5MMX100MML (ENDOMECHANICALS) ×7 IMPLANT
TUBING NON-CON 1/4 X 20 CONN (TUBING) IMPLANT
TUBING NON-CON 1/4 X 20' CONN (TUBING)
WARMER LAPAROSCOPE (MISCELLANEOUS) ×7 IMPLANT

## 2017-03-05 NOTE — Anesthesia Procedure Notes (Signed)
Procedure Name: Intubation Date/Time: 03/05/2017 7:38 AM Performed by: Raenette Rover, CRNA Pre-anesthesia Checklist: Patient identified, Emergency Drugs available, Suction available and Patient being monitored Patient Re-evaluated:Patient Re-evaluated prior to induction Oxygen Delivery Method: Circle system utilized Preoxygenation: Pre-oxygenation with 100% oxygen Induction Type: IV induction Ventilation: Mask ventilation without difficulty Laryngoscope Size: Mac and 3 Grade View: Grade II Tube type: Oral Tube size: 7.0 mm Number of attempts: 1 Airway Equipment and Method: Stylet Placement Confirmation: ETT inserted through vocal cords under direct vision,  positive ETCO2,  CO2 detector and breath sounds checked- equal and bilateral Secured at: 22 cm Tube secured with: Tape Dental Injury: Teeth and Oropharynx as per pre-operative assessment

## 2017-03-05 NOTE — Op Note (Signed)
Preoperative diagnosis: Cystocele and mild vault prolapse Postoperative diagnosis: Cystocele and mild vault prolapse Surgery: Cystocele repair and cystoscopy Surgeon: Dr. Lorin PicketScott Khalea Ventura Assistant: Harrie ForemanAmanda Dancy  The assistant was present and necessary for all steps of the operation described. The assistant played a critical role assisting during the operation  The patient underwent a hysterectomy laparoscopically.  The cuff was open.  The ureteral sacral ligaments were not tagged.  Leg position was good and performed by gynecology team.  Under anesthesia and is also noted by gynecology the patient had a well supported cuff and a narrow vagina and pelvis.  She had a grade 2 cystocele moderate in size.  She had minimal rectocele.  She had very good length  I cystoscoped the patient.  She had excellent efflux bilaterally.  She had a cystocele noted.  There was no bladder injury  Her support was such that it made the dissection a little bit more careful and slower.  I instilled 20 cc of lidocaine epinephrine mixture underneath the vaginal epithelium.  I use my Allis clamps to make by long anterior vaginal wall incision in the midline.  I sharply mobilized the overlying vaginal epithelium from the underlying pubocervical fascia to the white line bilaterally.  I was happy with my apical mobilization.  I did not take down apical  attachments.  I did a 2 layer anterior repair with 2-0 Vicryl suture not imbricating the bladder neck.  I kept excellent length.  I was very pleased with the repair.  I cystoscoped the patient.  There was excellent efflux bilaterally.  Cystoscopically she had a nice reduction of the cystocele in the midline and no distortion of the ureters  I only trimmed a few millimeters of anterior vaginal wall and closed the anterior vaginal wall with running 2-0 Vicryl and CT1 needle  I closed the cuff from left apex to the midline and right apex to the midline with 0 Vicryl on a CT1  needle.  I was careful with the placement of the suture since the ureteral sacral ligaments were not tagged.  Leg position was good.  Urine output was good.  Blood loss was less than 50 mL.    Visually the patient did not have a rectocele of any consequence.  When I did a rectal examination she had a lot of weakness and mobility of the perineal body and with it would come a distal grade 1 rectocele.  She had a posterior incision likely from the episiotomy.  She did not have a significant central defect to repair posteriorly and her tissues were quite mobile or elastic..  I did not note this much mobility on my preoperative evaluation when she was not under anesthesia.  In my opinion she did not need a posterior repair and arguably was at increased risk of some narrowing because of her smaller anatomy.  At the end of the case she had very good length and very good vaginal volume with no narrowing  Vaginal pack with Estrace cream was applied.  Hopefully the patient will reach her treatment goal

## 2017-03-05 NOTE — Anesthesia Preprocedure Evaluation (Addendum)
Anesthesia Evaluation  Patient identified by MRN, date of birth, ID band Patient awake    Reviewed: Allergy & Precautions, NPO status , Patient's Chart, lab work & pertinent test results  Airway Mallampati: II  TM Distance: >3 FB Neck ROM: Full    Dental  (+) Teeth Intact, Dental Advisory Given, Caps   Pulmonary asthma ,    Pulmonary exam normal breath sounds clear to auscultation       Cardiovascular hypertension, Pt. on medications Normal cardiovascular exam Rhythm:Regular Rate:Normal     Neuro/Psych negative neurological ROS     GI/Hepatic negative GI ROS, Neg liver ROS,   Endo/Other  negative endocrine ROS  Renal/GU negative Renal ROS    incomplete uterovaginal prolapse    Musculoskeletal negative musculoskeletal ROS (+)   Abdominal   Peds  Hematology negative hematology ROS (+)   Anesthesia Other Findings Day of surgery medications reviewed with the patient.  Reproductive/Obstetrics                            Anesthesia Physical Anesthesia Plan  ASA: II  Anesthesia Plan: General   Post-op Pain Management:    Induction: Intravenous  PONV Risk Score and Plan: 4 or greater and Midazolam, Scopolamine patch - Pre-op, Dexamethasone, Ondansetron and Propofol infusion  Airway Management Planned: Oral ETT  Additional Equipment:   Intra-op Plan:   Post-operative Plan: Extubation in OR  Informed Consent: I have reviewed the patients History and Physical, chart, labs and discussed the procedure including the risks, benefits and alternatives for the proposed anesthesia with the patient or authorized representative who has indicated his/her understanding and acceptance.   Dental advisory given  Plan Discussed with: CRNA  Anesthesia Plan Comments: (2nd PIV)        Anesthesia Quick Evaluation

## 2017-03-05 NOTE — Transfer of Care (Signed)
Immediate Anesthesia Transfer of Care Note  Patient: Jeanne Livingston  Procedure(s) Performed: TOTAL LAPAROSCOPIC HYSTERECTOMY WITH SALPINGECTOMY (Abdomen) ANTERIOR REPAIR (CYSTOCELE) (N/A Vagina ) CYSTOSCOPY (N/A Urethra)  Patient Location: PACU  Anesthesia Type:General  Level of Consciousness: awake, alert , oriented and patient cooperative  Airway & Oxygen Therapy: Patient Spontanous Breathing and Patient connected to nasal cannula oxygen  Post-op Assessment: Report given to RN and Post -op Vital signs reviewed and stable  Post vital signs: Reviewed and stable  Last Vitals:  Vitals:   03/05/17 0613  BP: 117/81  Pulse: 62  Resp: 16  Temp: 36.7 C  SpO2: 100%    Last Pain:  Vitals:   03/05/17 0613  TempSrc: Oral      Patients Stated Pain Goal: 4 (03/05/17 16100613)  Complications: No apparent anesthesia complications
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
## 2017-03-05 NOTE — H&P (Signed)
having some cystitis issues and was on probiotics and at 1 point time was considering local estrogen cream. She had a grade 1 cystocele or small grade 2 and a negative cystoscopy then. She was dry after the sling but had a little bit of foot on the floor syndrome If her bladder was full   The patient feels bulging intermittently especially with some activity such as yoga and cardio with biking. It is not daily. She has not had a hysterectomy. She is continent.   She voids every 1 hr and has a little bit of an urgent bladder. She generally has no nocturia at sometimes once or twice and reports a good flow   Moderate grade 2 cystocele with moderate central defect. The cystocele hinged over the urethral vesicle angle minimally. I did not feel or see any sling extrusion. She had no stress incontinence. She had no rectocele. The uterus descended from 8-9 cm to approximately 5 or 6 cm.   A picture was drawn. If the patient ever had surgery she would likely best benefit from a transvaginal hysterectomy with cystocele repair and possible vault suspension and graft. The role of urodynamics was discussed.   The patient will call if she wishes urodynamics. She has an appointment with Dr. Hyacinth MeekerMiller in January which likely will be good timing.   Today  Frequency in prolapse are stable  On urodynamics the patient emptied and fistulae. Bladder capacity was 400 mL. She had low pressure bladder overactivity. Importantly she did not leak with Valsalva pressure 110 cm of water with or without her prolapse reduced.The details of the urine after signing dictated   I drew picture. We talked about a transvaginal hysterectomy with cystocele repair and probable vault suspension. Mesh issues Described. She understands the rare risk of an undiagnosed sling issue and will be cystoscoped at the time. We were both okay leaving the cystoscopy to anesthesia. She is booked for the 12th of next month. We had a nice conversation about  healthcare       ALLERGIES: Ant Sulfa Drugs    MEDICATIONS: CoQ-10 10 MG CAPS Oral  Epipen 2-Pak 0.3 mg/0.3 ml auto-injector Injection  Juice Plus Fibre LIQD Oral  Losartan Potassium 50 mg tablet  Probiotic CAPS Oral  Vitamin K 2 MG/ML SOLN Injection     GU PSH: None     PSH Notes: Appendectomy, Knee Surgery, Bladder Surgery   NON-GU PSH: Appendectomy - 2013    GU PMH: Nocturia - 11/28/2016 Urinary Frequency - 11/28/2016 Chronic cystitis (w/o hematuria), Chronic cystitis - 2014 Pelvic/perineal pain, Vaginal pain - 2014      PMH Notes:  1898-01-22 00:00:00 - Note: Normal Routine History And Physical Adult   NON-GU PMH: Cardiac murmur, unspecified, Murmurs - 2014 Personal history of other endocrine, nutritional and metabolic disease, History of hypercholesterolemia - 2014 Hypertension    FAMILY HISTORY: 1 Daughter - Daughter 1 son - Son Chronic Renal Failure - Mother Family Health Status Number - Runs In Family Hypertension - Father, Mother Pure Hypercholesterolemia - Mother, Father   SOCIAL HISTORY: Marital Status: Married Preferred Language: English; Ethnicity: Not Hispanic Or Latino; Race: White Current Smoking Status: Patient has never smoked.   Tobacco Use Assessment Completed: Used Tobacco in last 30 days? Drinks 1 drink per day.  Does not drink caffeine. Patient's occupation is/was [employer].     Notes: Marital History - Currently Married, Occupation:, Caffeine Use, Alcohol Use, Never A Smoker, Tobacco Use   REVIEW OF SYSTEMS:  GU Review Female:   Patient denies frequent urination, hard to postpone urination, burning /pain with urination, get up at night to urinate, leakage of urine, stream starts and stops, trouble starting your stream, have to strain to urinate, and being pregnant.  Gastrointestinal (Upper):   Patient denies nausea, vomiting, and indigestion/ heartburn.  Gastrointestinal (Lower):   Patient denies diarrhea and constipation.   Constitutional:   Patient denies fever, night sweats, weight loss, and fatigue.  Skin:   Patient denies skin rash/ lesion and itching.  Eyes:   Patient denies blurred vision and double vision.  Ears/ Nose/ Throat:   Patient denies sore throat and sinus problems.  Hematologic/Lymphatic:   Patient denies swollen glands and easy bruising.  Cardiovascular:   Patient denies leg swelling and chest pains.  Respiratory:   Patient denies cough and shortness of breath.  Endocrine:   Patient denies excessive thirst.  Musculoskeletal:   Patient denies back pain and joint pain.  Neurological:   Patient denies headaches and dizziness.  Psychologic:   Patient denies depression and anxiety.   VITAL SIGNS: None   PAST DATA REVIEWED:  Source Of History:  Patient   PROCEDURES:          Urodynamics - 51600, [id number], [id number], [id number], [id number] URODYNAMICS STUDY  Test Indication: Frequency, Urgency, Prolapse The procedure's risks, benefits and infection risk were discussed with the patient.  PRE UROFLOW & CATHETERIZATION Procedure: Pre Uroflow Study The patient did not void. No urge to void. A FR straight catheter was inserted and 20 mls was obtained. A Urodynamic catheter was inserted. Urine dipstick was negative for WBCs and bacteria.  CYSTOMETRY/CYSTOMETROGRAM The bladder was filled with room temperature water at a rate of less than 50 cc per minute. Injection of contrast was performed for the cystometrogram. Max capacity was approx. 400 mls. First sensation occurred at 177 mls. Normal desire occurred at 227 mls. Strong desire occurred at 357  The was questionable low amplitude instability of 5 cmH20 at 350 mls. She did feel some increased urgency at the time but was able to inhibit this without leaking.   LEAK POINT PRESSURE LPPs were assessed with pt in a seated position. Both x-ray and visualization were used to assess for SUI.  No leakage noted with or without reduction of her prolapse with  abdominal pressures of 102-111 cmH20.   PRESSURE FLOW STUDY She was able to generate a voluntary contraction and void. Void below is from her 2nd void with no urgency. Volume voided approx. 226 mls. Max flow was 11 ml/s. Detrusor pressure at max flow was 13 cmH20. Max detrusor pressure was 14 cmH20. PVR was approx. 20 mls. Her 1st void was with some urgency, and the UD line was expelled during voiding.  ELECTROMYOGRAM Activity was measured by surface electrodes EMG activity increased during voiding.   FLUOROSCOPY and VCUG Mild trabeculation was noted. No reflux was seen. During cough and Valsalva, the bladder descended approximately 3 cm or more.  POST PROCEDURE ANTIBIOTICS: Cipro 500 mg po given post UDS.   NURSE IMPRESSION Ms. Vesey held a max capacity of approx. 400 mls. Her 1st sensation was felt at 177 mls. No SUI was noted with or without reduction of her prolapse. There appeared to be some low amplitude instability of 5 cmH20 at 350 mls. She did feel increased urgency at the time, but no leakage was noted. She was able to generate a voluntary contraction and void. Her 1st void was with some urgency, and the  UD line was expelled during her void. I refilled her bladder and had her void a second time. Her contraction was well sustained. There was increased EMG activity noted during her void. She left a pvr of approx. 20 mls. Mild trabeculation was noted, but no reflux was seen.          Urinalysis Dipstick Dipstick Cont'd  Color: Yellow Bilirubin: Neg  Appearance: Clear Ketones: Neg  Specific Gravity: 1.020 Blood: Neg  pH: <=5.0 Protein: Neg  Glucose: Neg Urobilinogen: 0.2    Nitrites: Neg    Leukocyte Esterase: Neg    ASSESSMENT:      ICD-10 Details  1 GU:   Urinary Frequency - R35.0   2   Nocturia - R35.1   3   Cystocele, midline - N81.11               Notes:   I drew her a picture and we talked about prolapse surgery in detail. Pros, cons, general surgical and  anesthetic risks, and other options including behavioral therapy, pessaries, and watchful waiting were discussed. She understands that prolapse repairs are successful in 80-85% of cases for prolapse symptoms and can recur anteriorly, posteriorly, and/or apically. She understands that in most cases I use a graft and general risks were discussed. Surgical risks were described but not limited to the discussion of injury to neighboring structures including the bowel (with possible life-threatening sepsis and colostomy), bladder, urethra, vagina (all resulting in further surgery), and ureter (resulting in re-implantation). We talked about injury to nerves/soft tissue leading to debilitating and intractable pelvic, abdominal, and lower extremity pain syndromes and neuropathies. The risks of buttock pain, intractable dyspareunia, and vaginal narrowing and shortening with sequelae were discussed. Bleeding risks, transfusion rates, and infection were discussed. The risk of persistent, de novo, or worsening bladder and/or bowel incontinence/dysfunction was discussed. The need for CIC was described as well the usual post-operative course. The patient understands that she might not reach her treatment goal and that she might be worse following surgery.   After a thorough review of the management options for the patient's condition the patient  elected to proceed with surgical therapy as noted above. We have discussed the potential benefits and risks of the procedure, side effects of the proposed treatment, the likelihood of the patient achieving the goals of the procedure, and any potential problems that might occur during the procedure or recuperation. Informed consent has been obtained.

## 2017-03-05 NOTE — Op Note (Addendum)
03/05/2017  9:05 AM  PATIENT:  Jeanne Livingston  62 y.o. female  PRE-OPERATIVE DIAGNOSIS:  incomplete uterovaginal prolapse, 3rd degree cystocele, 1st degree rectocele  POST-OPERATIVE DIAGNOSIS:  same  PROCEDURE:  Procedure(s): TOTAL LAPAROSCOPIC HYSTERECTOMY WITH SALPINGECTOMY  SURGEON:  Jerene BearsMary S Gunnard Dorrance  ASSISTANTS: Gertie ExonJill Jertson, MD   ANESTHESIA:   general  ESTIMATED BLOOD LOSS: 20cc  BLOOD ADMINISTERED:none   FLUIDS: 1000cc LR  UOP: 150cc pyridium stained urine  SPECIMEN:  Uterus, cervix and bilateral fallopian tubes  DISPOSITION OF SPECIMEN:  PATHOLOGY  FINDINGS: small uterus, bilateral fallopian tubes, normal and atrophic ovaries  DESCRIPTION OF OPERATION:  Patient is taken to the operating room. She is placed in the supine position. She is a running IV in place. Informed consent was present on the chart. SCDs on her lower extremities and functioning properly. Patient was positioned while she was awake.  Her legs were placed in the low lithotomy position in [place] stirrups. Her arms were tucked by the side.  General endotracheal anesthesia was administered by the anesthesia staff without difficulty. Dr. Desmond Lopeurk, anesthesia, oversaw case.  Time out performed.    Chlora prep was then used to prep the abdomen and Betadine was used to prep the inner thighs, perineum and vagina. Once 3 minutes had past the patient was draped in a normal standard fashion. The legs were lifted to the high lithotomy position. The cervix was visualized by placing a heavy weighted speculum in the posterior aspect of the vagina and using a curved Deaver retractor to the retract anteriorly. The anterior lip of the cervix was grasped with single-tooth tenaculum.  The uterus sounded to 6 cm. Pratt dilators were used to dilate the cervix up to a #19.  A RUMI uterine manipulator was obtained. A #6, purple disposable tip was placed on the RUMI manipulator as well as a 2.5, silver KOH ring. This was passed through the cervix  and the bulb of the disposable tip was inflated with 10 cc of normal saline. There was a good fit of the KOH ring around the cervix. The tenaculum was removed. There is also good manipulation of the uterus. The speculum and retractor were removed as well. A Foley catheter was placed to straight drain.  Orange tinged urine was noted. Legs were lowered to the low lithotomy position and attention was turned the abdomen.  The umbilicus was everted.  A Veress needle was obtained. Syringe of sterile saline was placed on a open Veress needle.  This was passed into the umbilicus until just when the fluid started to drip.  Then low flow CO2 gas was attached the needle and the pneumoperitoneum was achieved without difficulty. Once four liters of gas was in the abdomen the Veress needle was removed and a 5 millimeter non-bladed Optiview trocar and port were passed directly to the abdomen. The laparoscope was then used to confirm intraperitoneal placement. A small uterus with normal tubes and normal, atrophic ovaries were noted.  Ovaries were normal.  Are where prior tubal reanastomosis had been performed was seen and looks really nice, without any adhesions.  Locations for RLQ, LLQ, and suprapubic ports were noted by transillumination of the abdominal wall.  0.25% marcaine was used to anesthetize the skin.  8mm skin incision was made in the RLQ and an AirSeal port was placed underdirect visualization of the laparoscope.  Then a 5mm skin incision was made and a 5mm nonbladed trochar and port was placed in the LLQ.  All trochars were removed.  Ureters were identified.  Attention was turned to the right side. With uterus on stretch the right tube was excised off the ovary and mesosalpinx was dissected to free the tube. Then the right utero-ovarian pedicle was serially clamped cauterized and incised using the ligasure device. Right round ligament was serially clamped cauterized and incised. The anterior and posterior  peritoneum of the inferior leaf of the broad ligament were opened. The beginning of the bladder flap was created.  The bladder was taken down below the level of the KOH ring. The right uterine artery skeletonized and then just superior to the KOH ring this vessel was serially clamped, cauterized, and incised.  Attention was turned the left side.  The uterus was placed on stretch to the opposite side.  The tube was excised off the ovary using sharp dissection a bipolar cautery.  The mesosalpinx was incised freeing the tube. Then the left uterine ovarian pedicle was serially clamped cauterized and incised. Next the left round ligament was serially clamped cauterized and incised. The anterior posterior peritoneum of the inferiorly for the broad ligament were opened. The anterior peritoneum was carried across to the dissection on the left side. The remainder of the bladder flap was created using sharp dissection. The bladder was well below the level of the KOH ring. The left uterine artery skeletonized. Then the left uterine artery, above the level of the KOH ring, was serially clamped cauterized and incised. The uterus was devascularized at this point.  The colpotomy was performed a starting in the midline and using a harmonic scalpel with the inferior edge of the open blade  This was carried around a circumferential fashion until the vaginal mucosa was completely incised in the specimen was freed.  The specimen was then delivered to the vagina.  A vaginal occlusive device was used to maintain the pneumoperitoneum.  At this point, the pressure from the AirSeal device was lowered.  No bleeding was noted.  The remaining instruments were removed.  Instruments were removed.  The ports were removed under direct visualization of the laparoscope and the pneumoperitoneum was relieved.  The patient was taken out of Trendelenburg positioning.  Uterus was delivered through the vagina.    The skin was then closed with  subcuticular stitches of 3-0 Vicryl. The skin was cleansed Dermabond was applied. Attention was then turned the vagina and the cuff was inspected. No bleeding was noted.  The cuff was open at this point and Dr. Sherron Monday was in the room ready to start his portion of the procedure.  Foley was left in place at this point.  Sponge, lap, needle, initially counts were correct x2. Patient tolerated the procedure very well. She was awakened from anesthesia, extubated and taken to recovery in stable condition.    COUNTS:  YES  PLAN OF CARE: Pt is still in the OR with case being continued by Dr. Sherron Monday but she will be transferred to PACU when procedure is ended.

## 2017-03-05 NOTE — Progress Notes (Signed)
Day of Surgery Procedure(s) (LRB): ANTERIOR REPAIR (CYSTOCELE) (N/A) CYSTOSCOPY (N/A) TOTAL LAPAROSCOPIC HYSTERECTOMY WITH SALPINGECTOMY  Subjective: Patient reports no nausea and good pain control.  Has walked.  Is eating dinner.  Questions from surgery answered.  Objective: I have reviewed patient's vital signs, intake and output, medications and labs.  General: alert and cooperative Resp: clear to auscultation bilaterally Cardio: regular rate and rhythm, S1, S2 normal, no murmur, click, rub or gallop GI: soft, non-tender; bowel sounds normal; no masses,  no organomegaly and incision: clean, dry and intact Extremities: extremities normal, atraumatic, no cyanosis or edema Vaginal Bleeding: minimal  Assessment: s/p Procedure(s): ANTERIOR REPAIR (CYSTOCELE) (N/A) CYSTOSCOPY (N/A) TOTAL LAPAROSCOPIC HYSTERECTOMY WITH SALPINGECTOMY: stable and progressing well  Plan: Advance diet Encourage ambulation Advance to PO medication  LOS: 0 days    Jerene BearsMary S Zionna Homewood 03/05/2017, 6:33 PM

## 2017-03-06 ENCOUNTER — Encounter (HOSPITAL_COMMUNITY): Payer: Self-pay | Admitting: Obstetrics & Gynecology

## 2017-03-06 ENCOUNTER — Other Ambulatory Visit: Payer: Self-pay | Admitting: Obstetrics & Gynecology

## 2017-03-06 DIAGNOSIS — N812 Incomplete uterovaginal prolapse: Secondary | ICD-10-CM | POA: Diagnosis not present

## 2017-03-06 DIAGNOSIS — N813 Complete uterovaginal prolapse: Secondary | ICD-10-CM | POA: Diagnosis not present

## 2017-03-06 DIAGNOSIS — Z885 Allergy status to narcotic agent status: Secondary | ICD-10-CM | POA: Diagnosis not present

## 2017-03-06 DIAGNOSIS — Z8679 Personal history of other diseases of the circulatory system: Secondary | ICD-10-CM | POA: Diagnosis not present

## 2017-03-06 DIAGNOSIS — I10 Essential (primary) hypertension: Secondary | ICD-10-CM | POA: Diagnosis not present

## 2017-03-06 DIAGNOSIS — Z91030 Bee allergy status: Secondary | ICD-10-CM | POA: Diagnosis not present

## 2017-03-06 DIAGNOSIS — N816 Rectocele: Secondary | ICD-10-CM | POA: Diagnosis not present

## 2017-03-06 DIAGNOSIS — Z882 Allergy status to sulfonamides status: Secondary | ICD-10-CM | POA: Diagnosis not present

## 2017-03-06 DIAGNOSIS — Z91038 Other insect allergy status: Secondary | ICD-10-CM | POA: Diagnosis not present

## 2017-03-06 DIAGNOSIS — J45990 Exercise induced bronchospasm: Secondary | ICD-10-CM | POA: Diagnosis not present

## 2017-03-06 DIAGNOSIS — E785 Hyperlipidemia, unspecified: Secondary | ICD-10-CM | POA: Diagnosis not present

## 2017-03-06 DIAGNOSIS — Z79899 Other long term (current) drug therapy: Secondary | ICD-10-CM | POA: Diagnosis not present

## 2017-03-06 LAB — BASIC METABOLIC PANEL
ANION GAP: 6 (ref 5–15)
BUN: 14 mg/dL (ref 6–20)
CO2: 23 mmol/L (ref 22–32)
Calcium: 8.3 mg/dL — ABNORMAL LOW (ref 8.9–10.3)
Chloride: 107 mmol/L (ref 101–111)
Creatinine, Ser: 0.93 mg/dL (ref 0.44–1.00)
GFR calc Af Amer: >60 mL/min (ref >60)
Glucose, Bld: 102 mg/dL — ABNORMAL HIGH (ref 65–99)
POTASSIUM: 3.6 mmol/L (ref 3.5–5.1)
SODIUM: 136 mmol/L (ref 135–145)

## 2017-03-06 LAB — CBC
HEMATOCRIT: 31.9 % — AB (ref 36.0–46.0)
HEMOGLOBIN: 10.8 g/dL — AB (ref 12.0–15.0)
MCH: 32.2 pg (ref 26.0–34.0)
MCHC: 33.9 g/dL (ref 30.0–36.0)
MCV: 95.2 fL (ref 78.0–100.0)
Platelets: 198 10*3/uL (ref 150–400)
RBC: 3.35 MIL/uL — ABNORMAL LOW (ref 3.87–5.11)
RDW: 12.7 % (ref 11.5–15.5)
WBC: 8.5 10*3/uL (ref 4.0–10.5)

## 2017-03-06 MED ORDER — IBUPROFEN 800 MG PO TABS: 800.0000 mg | ORAL_TABLET | Freq: Three times a day (TID) | ORAL | 0 refills | Status: DC | PRN

## 2017-03-06 MED ORDER — HYDROCODONE-ACETAMINOPHEN 5-325 MG PO TABS: 1.0000 | ORAL_TABLET | ORAL | 0 refills | Status: DC | PRN

## 2017-03-06 MED ORDER — HYDROCODONE-ACETAMINOPHEN 5-325 MG PO TABS
1.0000 | ORAL_TABLET | ORAL | Status: DC | PRN
Start: 2017-03-06 — End: 2017-03-06

## 2017-03-06 MED ORDER — IBUPROFEN 800 MG PO TABS
800.0000 mg | ORAL_TABLET | Freq: Three times a day (TID) | ORAL | Status: DC
  Administered 2017-03-06: 800 mg via ORAL
  Filled 2017-03-06: qty 1

## 2017-03-06 NOTE — Progress Notes (Signed)
Pt discharged to home with husband.  Condition stable.  Discharge instructions reviewed with pt and husband together.  Pt ambulated to car with Irineo AxonK. McNeill, NT.  No equipment for home ordered at discharge.

## 2017-03-06 NOTE — Discharge Summary (Signed)
Physician Discharge Summary  Patient ID: Jeanne Livingston MRN: [id number] DOB/AGE: 1955/03/16 62 y.o.  Admit date: 03/05/2017 Discharge date: 03/06/2017  Admission Diagnoses: incomplete uterine prolapse, cystocele  Discharge Diagnoses:  Active Problems:   * No active hospital problems. *   Discharged Condition: good  Hospital Course: Patient admitted through same day surgery.  She was taken to OR where TLH/bilateral salpingectomy, cystoscopy and cystocele repair (with Dr. Sherron Monday) were performed.  Surgical findings included normal pelvic, normal ovaries and normal upper abdomen.  Surgery was uneventful.  Once procedure was complete, foley catheter was left in placed as pt had packing present.  Patient transferred to PACU where she was stable and then to 3rd floor for the remainder of her hospitalization.  During her post-op recovery, her vitals and stable and she was AF.  In evening of POD#0, she was able to transition to oral pain medications and regular diet.  She was able to ambulate and she had good pain control.  She required no oral pain medications.  In the AM of POD#1, her vaginal packing was removed as well as the foley catheter.  She was also able to void on her own.  She was without complaint.  Post op hb was 10.8.  At this point, patient was voiding, walking, having excellent pain control, had no nausea, and minimal vaginal bleeding.  She was ready for D/C.  Consults: None  Significant Diagnostic Studies: labs: post op CBC with hb 10.8  Treatments: surgery: TLH/bilateral salpingectomy, cystocele repair and cystoscopy with Dr. Sherron Monday.  Discharge Exam: Blood pressure (!) 111/57, pulse 70, temperature 97.9 F (36.6 C), temperature source Oral, resp. rate 18, height 5\' 4"  (1.626 m), weight 142 lb (64.4 kg), last menstrual period 11/22/2009, SpO2 100 %. General appearance: alert, cooperative and no distress Resp: clear to auscultation bilaterally Cardio: regular rate and rhythm, S1,  S2 normal, no murmur, click, rub or gallop GI: soft, non-tender; bowel sounds normal; no masses,  no organomegaly Extremities: extremities normal, atraumatic, no cyanosis or edema Incision/Wound:c/d/i  Disposition: Final discharge disposition not confirmed   Allergies as of 03/06/2017      Reactions   Bee Venom Anaphylaxis   Codeine    Heart races   Other Hives, Itching   Fire ants- facial swelling   Sulfa Antibiotics Hives      Medication List    TAKE these medications   B-12 SL Place 2,000 mcg under the tongue. 5 times weekly   BLACK COHOSH PO Take 545 mg by mouth daily.   Co Q 10 100 MG Caps Take 100-200 mg by mouth daily.   EPIPEN 2-PAK 0.3 mg/0.3 mL Soaj injection Generic drug:  EPINEPHrine Inject 0.3 mg into the skin once.   folic acid 400 MCG tablet Commonly known as:  FOLVITE Take 400 mcg by mouth 4 (four) times a week.   GNP GARLIC EXTRACT PO Take 2 tablets by mouth 3 (three) times daily with meals.   HYDROcodone-acetaminophen 5-325 MG tablet Commonly known as:  NORCO/VICODIN Take 1-2 tablets by mouth every 4 (four) hours as needed for moderate pain or severe pain.   hydrocortisone 2.5 % ointment APPLY NIGHTLY AT BEDTIME TO RIGHT EYELID AS NEEDED FOR DRYNESS   ibuprofen 800 MG tablet Commonly known as:  ADVIL,MOTRIN Take 1 tablet (800 mg total) by mouth every 8 (eight) hours as needed for moderate pain or cramping. What changed:    medication strength  how much to take  when to take this  reasons  to take this   losartan 50 MG tablet Commonly known as:  COZAAR Take 50 mg by mouth at bedtime.   MAGNESIUM-POTASSIUM PO Take 1 tablet by mouth 3 (three) times a week.   NON FORMULARY Isotonic Calcium Complete powder, 2 cap full mixed with liquid once a day   NON FORMULARY Take 250 mg by mouth 4 (four) times a week. fulvic acid   NON FORMULARY maca root   NON FORMULARY glysen   NON FORMULARY Jade screen & xanthium   NON  FORMULARY Polyporus and dianthus   NON FORMULARY Yin chiao   NON FORMULARY Vitamin d and K2 powder, 1 capful mixed in liquid once daily   NON FORMULARY Take 2 tablets by mouth daily. glycen   OVER THE COUNTER MEDICATION Take 3 tablets by mouth daily as needed (sinuses). jade screen & xanthium formula otc supplement   OVER THE COUNTER MEDICATION Place 1 Dose under the tongue daily as needed (immune support). myco immune otc sublingual liquid   SYSTANE OP Place 1 drop into both eyes 2 (two) times daily.   ULTRA OMEGA 3 PO Take 1,280 mg by mouth daily.   Vitamin D 2000 units Caps Take 2,000 Units by mouth. 5 times weekly      Follow-up Information    MacDiarmid, Lorin PicketScott, MD Follow up in 2 week(s).   Specialty:  Urology Why:  Dr. Sherron MondayMacDiarmid would like to see you in two weeks.  I've cancelled the one week appt with me.  Then I will see you in 6 weeks.  He will then see you in 12 weeks.  Contact information: [address] [place] [id number] [phone]        Jerene BearsMiller, Gemini Bunte S, MD Follow up on 04/19/2017.   Specialty:  Gynecology Why:  appt time 11:15am Contact information: [address] [place] [id number] [phone]           Signed: Jerene BearsMary S Janiqua Friscia 03/06/2017, 12:42 PM

## 2017-03-06 NOTE — Progress Notes (Signed)
Foley catheter removed at 8:15am by Dr. Hyacinth MeekerMiller.    10:20am  Void 250 ml   Bladder Scan 36 ml 11:45am  Void 300 ml   Bladder Scan 71 ml  Phoned Dr. Sherron MondayMacDiarmid with above information.  He stated that patient could go home whenever she is ready.  Will phone Dr. Hyacinth MeekerMiller now to let her know.

## 2017-03-06 NOTE — Discharge Instructions (Signed)
Post Op Hysterectomy Instructions Please read the instructions below. Refer to these instructions for the next few weeks. These instructions provide you with general information on caring for yourself after surgery. Your caregiver may also give you specific instructions. While your treatment has been planned according to the most current medical practices available, unavoidable problems sometimes happen. If you have any problems or questions after you leave, please call your caregiver.  HOME CARE INSTRUCTIONS Healing will take time. You will have discomfort, tenderness, swelling and bruising at the operative site for a couple of weeks. This is normal and will get better as time goes on.   Only take over-the-counter or prescription medicines for pain, discomfort or fever as directed by your caregiver.   Do not take aspirin. It can cause bleeding.   Do not drive when taking pain medication.   Follow your caregivers advice regarding diet, exercise, lifting, driving and general activities.   Resume your usual diet as directed and allowed.   Get plenty of rest and sleep.   Do not douche, use tampons, or have sexual intercourse until your caregiver gives you permission. .   Take your temperature if you feel hot or flushed.   You may shower today when you get home.  No tub bath for one week.    Do not drink alcohol until you are not taking any narcotic pain medications.   Try to have someone home with you for a week or two to help with the household activities.   Be careful over the next two to three weeks with any activities at home that involve lifting, pushing, or pulling.  Listen to your body--if something feels uncomfortable to do, then don't do it.  Make sure you and your family understands everything about your operation and recovery.   Walking up stairs is fine.  Do not sign any legal documents until you feel normal again.   Keep all your follow-up appointments as recommended by  your caregiver.   PLEASE CALL THE OFFICE IF:  There is swelling, redness or increasing pain in the wound area.   Pus is coming from the wound.   You notice a bad smell from the wound or surgical dressing.   You have pain, redness and swelling from the intravenous site.   The wound is breaking open (the edges are not staying together).    You develop pain or bleeding when you urinate.   You develop abnormal vaginal discharge.   You have any type of abnormal reaction or develop an allergy to your medication.   You need stronger pain medication for your pain   SEEK IMMEDIATE MEDICAL CARE:  You develop a temperature of 100.5 or higher.   You develop abdominal pain.   You develop chest pain.   You develop shortness of breath.   You pass out.   You develop pain, swelling or redness of your leg.   You develop heavy vaginal bleeding with or without blood clots.   MEDICATIONS:  Restart your regular medications BUT wait one week before restarting all vitamins and mineral supplements  Use Motrin 800mg  every 8 hours for the next several days.  This will help you use less Vicodin.  Use the Vicodin 5/325 1-2 tabs every 4-6 hours as needed for pain.  You can also alternate Tylenol 2-500mg  tablets every six hours with the motrin if this is enough for pain control.  You may use an over the counter stool softener like Colace or Dulcolax to  help with starting a bowel movement.  Start the day after you go home.  Warm liquids, fluids, and ambulation help too.  If you have not had a bowel movement in four days, you need to call the office.  Do not start your vitamins or supplements until you are having regular bowel movements.

## 2017-03-06 NOTE — Progress Notes (Signed)
Yesterday reviewed findings Today labs and vitals ok No leg pain Post op detailed Voiding trial Send home

## 2017-03-06 NOTE — Progress Notes (Signed)
Spoke to [place] at Dr. Rondel BatonMiller's office.  She will relay message about discharge readiness when Dr. Hyacinth MeekerMiller is finished with her current patient in the office.

## 2017-03-06 NOTE — Progress Notes (Signed)
1 Day Post-Op Procedure(s) (LRB): ANTERIOR REPAIR (CYSTOCELE) (N/A) CYSTOSCOPY (N/A) TOTAL LAPAROSCOPIC HYSTERECTOMY WITH BILATERAL SALPINGECTOMY  Subjective: Patient reports no nausea, good pain control.  Shoulders did have some soreness yesterday.  Heating pad has really helped.  Has walked and eaten.  On scheduled Toradol only.  Objective: I have reviewed patient's vital signs, intake and output, medications and labs.  General: alert and cooperative Resp: clear to auscultation bilaterally Cardio: regular rate and rhythm, S1, S2 normal, no murmur, click, rub or gallop GI: soft, non-tender; bowel sounds normal; no masses,  no organomegaly and incision: clean, dry and intact Extremities: extremities normal, atraumatic, no cyanosis or edema Vaginal Bleeding: minimal  Assessment: s/p Procedure(s): ANTERIOR REPAIR (CYSTOCELE) (N/A) CYSTOSCOPY (N/A) TOTAL LAPAROSCOPIC HYSTERECTOMY WITH BILATERAL SALPINGECTOMY: stable and progressing well  Plan: Encourage ambulation Advance to PO medication Voiding trials today  Hopeful discharge later today  LOS: 0 days    Jerene BearsMary S Tesha Archambeau 03/06/2017, 8:18 AM

## 2017-03-06 NOTE — Anesthesia Postprocedure Evaluation (Signed)
Anesthesia Post Note  Patient: Jeanne Livingston  Procedure(s) Performed: TOTAL LAPAROSCOPIC HYSTERECTOMY WITH BILATERAL SALPINGECTOMY (Abdomen) ANTERIOR REPAIR (CYSTOCELE) (N/A Vagina ) CYSTOSCOPY (N/A Urethra)     Patient location during evaluation: PACU Anesthesia Type: General Level of consciousness: awake and alert, oriented and awake Pain management: pain level controlled Vital Signs Assessment: post-procedure vital signs reviewed and stable Respiratory status: spontaneous breathing, nonlabored ventilation, respiratory function stable and patient connected to nasal cannula oxygen Cardiovascular status: blood pressure returned to baseline and stable Postop Assessment: no apparent nausea or vomiting Anesthetic complications: no    Last Vitals:  Vitals:   03/06/17 0443 03/06/17 0732  BP: (!) 105/51 (!) 108/55  Pulse: 62 65  Resp: 17 18  Temp: 36.8 C 36.8 C  SpO2: 98% 97%    Last Pain:  Vitals:   03/06/17 0732  TempSrc: Oral  PainSc:                  Cecile HearingStephen Edward Turk

## 2017-03-08 ENCOUNTER — Ambulatory Visit: Payer: BLUE CROSS/BLUE SHIELD | Admitting: Obstetrics & Gynecology

## 2017-03-12 ENCOUNTER — Ambulatory Visit: Payer: BLUE CROSS/BLUE SHIELD | Admitting: Obstetrics & Gynecology

## 2017-03-19 DIAGNOSIS — N8111 Cystocele, midline: Secondary | ICD-10-CM | POA: Diagnosis not present

## 2017-03-22 DIAGNOSIS — L570 Actinic keratosis: Secondary | ICD-10-CM | POA: Diagnosis not present

## 2017-03-22 DIAGNOSIS — D0372 Melanoma in situ of left lower limb, including hip: Secondary | ICD-10-CM | POA: Diagnosis not present

## 2017-03-22 DIAGNOSIS — L820 Inflamed seborrheic keratosis: Secondary | ICD-10-CM | POA: Diagnosis not present

## 2017-03-22 DIAGNOSIS — D485 Neoplasm of uncertain behavior of skin: Secondary | ICD-10-CM | POA: Diagnosis not present

## 2017-03-27 ENCOUNTER — Telehealth: Payer: Self-pay | Admitting: Obstetrics & Gynecology

## 2017-03-27 NOTE — Telephone Encounter (Signed)
Patient said she had surgery recent and has some questions.

## 2017-03-27 NOTE — Telephone Encounter (Signed)
Spoke with patient. S/p TLH, anterior repair and cysto on 03/05/17. Patient seen by Dr. McDiarmid last wk for f/u, was advised can lift 10-15lb weights, calling to for post op instructions from Dr. Hyacinth MeekerMiller.   Reports no bleeding and occasional  "twinges", no pain.  Recommended no heavy lifting > 10lbs.  Increase exercise slowly. No strenuous exercises. Keep post appt scheduled for 3/29 with Dr. Hyacinth MeekerMiller.   Advised Dr. Hyacinth MeekerMiller will review, I will return call with any additional recommendations.  Routing to provider for final review. Patient is agreeable to disposition. Will close encounter.

## 2017-03-28 ENCOUNTER — Telehealth: Payer: Self-pay

## 2017-03-28 ENCOUNTER — Telehealth: Payer: Self-pay | Admitting: Obstetrics & Gynecology

## 2017-03-28 NOTE — Telephone Encounter (Signed)
Patient has developed "a bulge after having surgery".

## 2017-03-28 NOTE — Telephone Encounter (Signed)
Spoke with patient. Advised of message as seen below from Dr.Miller. Patient declines to schedule CT scan. "I do not want to have that done. I will wait and see how things go because it seems to be going down already." Advised that we cannot ensure if this is a soft tissue mass vs hernia without imaging. Patient again declines stating that she will contact the office if symptoms persist or worsen to schedule CT scan.  Routing to provider for final review.

## 2017-03-28 NOTE — Telephone Encounter (Signed)
I think she needs an OV but would her PCP look at it first so she doesn't have to drive all the way here?

## 2017-03-28 NOTE — Telephone Encounter (Signed)
Jeanne RoundsSally called pt and asked for pictures to be sent securely via [facility] email.  Pictures reviewed.  Appears to be a soft tissue mass vs hernia that is clearly to the right and superior to either incision for surgery.  Needs CT of abdomen to assess for mass/hernia.  [place] be [place] in [place].  Can you work on this?

## 2017-03-28 NOTE — Telephone Encounter (Signed)
Spoke with patient. Advised of message as seen below from Dr.Miller. Patient will call her PCP to see if she can be seen as this is closer to her home. Will return call to let the office know if she was able to get an appointment or if she would like to be seen with Dr.Miller.  Routing to provider for final review. Patient agreeable to disposition. Will close encounter.

## 2017-03-28 NOTE — Telephone Encounter (Signed)
Patient had a total laparoscopic hysterectomy with bilateral salpingectomy anterior repair with cytoscopy on 03/05/2017. Patient states yesterday she noticed a bulge to the right of her belly button that is tender. Denies any pain. Reports when she moves side to side she does not feel anything. Does not notice more with lifting or certain movements. Reports the area feels tight and burns. Advised will need to be seen in the office for further evaluation. Patient states that she lives in [place] and does not want to come in without speaking with Dr.Miller first. Algis Downsdvised will review with Dr.Miller and return call.

## 2017-03-28 NOTE — Telephone Encounter (Signed)
Spoke with patient. See telephone note dated from earlier today. Patient states that she reached out to her PCP and cannot be seen. Would like to be seen with Dr.Miller for evaluation is able to drive into town today or tomorrow. Takes 2 1/2 hours to get here. Advised will review with Dr.Miller and return call.

## 2017-03-28 NOTE — Telephone Encounter (Signed)
Left message to call Kaitlyn at [phone]. 

## 2017-03-29 ENCOUNTER — Telehealth: Payer: Self-pay | Admitting: Obstetrics & Gynecology

## 2017-03-29 NOTE — Telephone Encounter (Signed)
Can you please call pt and get an update today?  I'd love another picture if possible.    CC:  Jeanne RuddySally Livingston as this is the email where picture was sent.  Thanks.

## 2017-03-29 NOTE — Telephone Encounter (Signed)
Spoke with patient who states that she is having additional pain and is concerned about this being seen on ordered imaging. Appointment scheduled with Dr.Miller for assessment on 04/01/2017 at 2:30 pm. CT abdomen w contrast cancelled. Patient is aware and agreeable.  Routing to provider for final review. Patient agreeable to disposition. Will close encounter.

## 2017-03-29 NOTE — Telephone Encounter (Signed)
Boneta LucksJenny with [facility] ct department calling about the ct order for patient.  [phone]
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
## 2017-03-29 NOTE — Telephone Encounter (Signed)
Spoke with patient. Advised PA for CT abdomen has been pre-approved with insurance company. Patient is scheduled for 04/01/2017 at 12:15 pm at [facility] [address], [place], [place] [id number]. Patient will need to pick up contrast today. Will have to drink 1 bottle of contrast the night before, 1/2 a bottle 4 hours before, and bring the other half to the imaging location. Will have BUN and Creatinine drawn at her appointment. Needs to arrive at 11 am and be NPO for 4 hours prior. Patient verbalizes understanding and is placed in imaging hold.  Routing to provider for final review. Patient agreeable to disposition. Will close encounter.

## 2017-03-29 NOTE — Telephone Encounter (Signed)
Spoke with patient. Patient will send updated pictures securely to Billie RuddySally Yeakley, RN for Dr.Miller to review.

## 2017-03-29 NOTE — Telephone Encounter (Signed)
Call to patient. Advised Dr Hyacinth MeekerMiller has reviewed photos and recommends proceed with CT.  Advised although mass looks slightly smaller today, still clearly visible and need CT for diagnosis.  Delay in diagnosis increases risk for more advanced surgical procedure, emergency procedure. Precautions given.  Patient agreeable to proceed.  CT will be scheduled at radiology facility [place].

## 2017-03-29 NOTE — Telephone Encounter (Signed)
Call back from patient after receiving contrast for procedure.  Patient concerned she needs CT of pelvis in additional to CT of abdomen. Feels has pain below hip bone. Concerned that bulge originates lower in pelvis. Advised office visit with MD for full evaluation and ordering of appropriate tests. Patient agreeable.  Appointment scheduled for 04-01-17 at 230 with Dr Hyacinth MeekerMiller.  Precautions given for emergency care for pain, nausea, vomiting, fever, significant change in size of mass.

## 2017-04-01 ENCOUNTER — Encounter: Payer: Self-pay | Admitting: Obstetrics & Gynecology

## 2017-04-01 ENCOUNTER — Telehealth: Payer: Self-pay | Admitting: *Deleted

## 2017-04-01 ENCOUNTER — Other Ambulatory Visit: Payer: BLUE CROSS/BLUE SHIELD

## 2017-04-01 ENCOUNTER — Other Ambulatory Visit: Payer: Self-pay | Admitting: *Deleted

## 2017-04-01 ENCOUNTER — Ambulatory Visit (INDEPENDENT_AMBULATORY_CARE_PROVIDER_SITE_OTHER): Payer: BLUE CROSS/BLUE SHIELD | Admitting: Obstetrics & Gynecology

## 2017-04-01 ENCOUNTER — Ambulatory Visit
Admission: RE | Admit: 2017-04-01 | Discharge: 2017-04-01 | Disposition: A | Discharge status: Home / Self Care | Payer: BLUE CROSS/BLUE SHIELD | Source: Ambulatory Visit | Attending: Obstetrics & Gynecology | Admitting: Obstetrics & Gynecology

## 2017-04-01 VITALS — BP 122/80 | HR 60 | Resp 16 | Wt 143.0 lb

## 2017-04-01 DIAGNOSIS — R1903 Right lower quadrant abdominal swelling, mass and lump: Secondary | ICD-10-CM | POA: Diagnosis not present

## 2017-04-01 DIAGNOSIS — R1900 Intra-abdominal and pelvic swelling, mass and lump, unspecified site: Secondary | ICD-10-CM

## 2017-04-01 MED ORDER — IOPAMIDOL (ISOVUE-300) INJECTION 61%
100.0000 mL | Freq: Once | INTRAVENOUS | Status: AC | PRN
  Administered 2017-04-01: 100 mL via INTRAVENOUS

## 2017-04-01 NOTE — Telephone Encounter (Signed)
Robin calling from [facility], confirming order for CT abdomen and pelvis, was advised to place new order for CT abdomen pelvis with contrast.   New order placed for CT abdomen and pelvis with contrast.   Routing to Dr. Hyacinth MeekerMiller, will close encounter.
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
## 2017-04-09 DIAGNOSIS — L905 Scar conditions and fibrosis of skin: Secondary | ICD-10-CM | POA: Diagnosis not present

## 2017-04-09 DIAGNOSIS — D0372 Melanoma in situ of left lower limb, including hip: Secondary | ICD-10-CM | POA: Diagnosis not present

## 2017-04-11 ENCOUNTER — Telehealth: Payer: Self-pay | Admitting: *Deleted

## 2017-04-11 DIAGNOSIS — N8189 Other female genital prolapse: Secondary | ICD-10-CM

## 2017-04-11 NOTE — Telephone Encounter (Signed)
Spoke with patient, advised as seen below per Dr. Hyacinth MeekerMiller. Patient agreeable to plan.

## 2017-04-11 NOTE — Telephone Encounter (Signed)
Notes recorded by Jerene BearsMiller, Mary S, MD on 04/11/2017 at 9:43 AM EDT Pt is aware of CT scan results. I discussed this with her personally. She needs a referral for pelvic PT to [facility], phone [phone], fax [phone]. Dx; Pelvic relaxation. Can start in two more weeks. Can you take care of this referral and let pt know this information? Also, she does need to see a female provider for this pelvic PT. I spoke with two local providers about where they send pts and both gave the same information. She may ask about this. Thanks.

## 2017-04-11 NOTE — Telephone Encounter (Signed)
Spoke with Melody, was advised to fax OV notes, demographics and referral to [phone], they will review and call patient directly to schedule.   Requested information faxed.   Routing to Referral Coordinator for f/u. Will close encounter.   Cc: Dr. Hyacinth MeekerMiller

## 2017-04-19 ENCOUNTER — Ambulatory Visit: Payer: BLUE CROSS/BLUE SHIELD | Admitting: Obstetrics & Gynecology

## 2017-05-07 DIAGNOSIS — N819 Female genital prolapse, unspecified: Secondary | ICD-10-CM | POA: Diagnosis not present

## 2017-05-07 DIAGNOSIS — M6281 Muscle weakness (generalized): Secondary | ICD-10-CM | POA: Diagnosis not present

## 2017-05-15 DIAGNOSIS — M6281 Muscle weakness (generalized): Secondary | ICD-10-CM | POA: Diagnosis not present

## 2017-05-15 DIAGNOSIS — N819 Female genital prolapse, unspecified: Secondary | ICD-10-CM | POA: Diagnosis not present

## 2017-05-22 DIAGNOSIS — M6281 Muscle weakness (generalized): Secondary | ICD-10-CM | POA: Diagnosis not present

## 2017-05-22 DIAGNOSIS — N819 Female genital prolapse, unspecified: Secondary | ICD-10-CM | POA: Diagnosis not present

## 2017-05-28 DIAGNOSIS — M6281 Muscle weakness (generalized): Secondary | ICD-10-CM | POA: Diagnosis not present

## 2017-05-28 DIAGNOSIS — N819 Female genital prolapse, unspecified: Secondary | ICD-10-CM | POA: Diagnosis not present

## 2017-05-29 DIAGNOSIS — N8111 Cystocele, midline: Secondary | ICD-10-CM | POA: Diagnosis not present

## 2017-06-11 DIAGNOSIS — N819 Female genital prolapse, unspecified: Secondary | ICD-10-CM | POA: Diagnosis not present

## 2017-06-11 DIAGNOSIS — M6281 Muscle weakness (generalized): Secondary | ICD-10-CM | POA: Diagnosis not present

## 2017-06-25 DIAGNOSIS — L821 Other seborrheic keratosis: Secondary | ICD-10-CM | POA: Diagnosis not present

## 2017-06-25 DIAGNOSIS — D485 Neoplasm of uncertain behavior of skin: Secondary | ICD-10-CM | POA: Diagnosis not present

## 2017-10-10 DIAGNOSIS — L820 Inflamed seborrheic keratosis: Secondary | ICD-10-CM | POA: Diagnosis not present

## 2017-10-10 DIAGNOSIS — L570 Actinic keratosis: Secondary | ICD-10-CM | POA: Diagnosis not present

## 2017-10-10 DIAGNOSIS — L821 Other seborrheic keratosis: Secondary | ICD-10-CM | POA: Diagnosis not present

## 2017-10-10 DIAGNOSIS — D2272 Melanocytic nevi of left lower limb, including hip: Secondary | ICD-10-CM | POA: Diagnosis not present

## 2017-10-10 DIAGNOSIS — Z872 Personal history of diseases of the skin and subcutaneous tissue: Secondary | ICD-10-CM | POA: Diagnosis not present

## 2018-01-25 DIAGNOSIS — M62830 Muscle spasm of back: Secondary | ICD-10-CM | POA: Diagnosis not present

## 2018-01-25 DIAGNOSIS — M25512 Pain in left shoulder: Secondary | ICD-10-CM | POA: Diagnosis not present

## 2018-01-25 DIAGNOSIS — M546 Pain in thoracic spine: Secondary | ICD-10-CM | POA: Diagnosis not present

## 2018-01-25 DIAGNOSIS — M25511 Pain in right shoulder: Secondary | ICD-10-CM | POA: Diagnosis not present

## 2018-02-07 DIAGNOSIS — H43393 Other vitreous opacities, bilateral: Secondary | ICD-10-CM | POA: Diagnosis not present

## 2018-02-07 DIAGNOSIS — H2513 Age-related nuclear cataract, bilateral: Secondary | ICD-10-CM | POA: Diagnosis not present

## 2018-02-07 DIAGNOSIS — H524 Presbyopia: Secondary | ICD-10-CM | POA: Diagnosis not present

## 2018-02-17 ENCOUNTER — Telehealth: Payer: Self-pay | Admitting: Obstetrics & Gynecology

## 2018-02-17 NOTE — Telephone Encounter (Signed)
Patient is asking for an BMD order to be faxed to [facility] in [place], [place] [phone]. She asked for a return call when this has been done.

## 2018-02-17 NOTE — Telephone Encounter (Signed)
BMD order faxed to [facility].   Patient notified. Patient will contact [facility] [place] directly to schedule. Patient verbalizes understanding and is agreeable.   Encounter closed.

## 2018-02-17 NOTE — Telephone Encounter (Signed)
Last AEX 11/18/15 Next AEX 02/24/18 Last BMD 10/12/14, osteopenia.   Written order to Dr. Hyacinth Meeker for review.

## 2018-02-19 DIAGNOSIS — M8580 Other specified disorders of bone density and structure, unspecified site: Secondary | ICD-10-CM | POA: Diagnosis not present

## 2018-02-19 DIAGNOSIS — M8589 Other specified disorders of bone density and structure, multiple sites: Secondary | ICD-10-CM | POA: Diagnosis not present

## 2018-02-20 ENCOUNTER — Telehealth: Payer: Self-pay | Admitting: *Deleted

## 2018-02-20 NOTE — Telephone Encounter (Signed)
Patient notified of results. Report to scan.  

## 2018-02-20 NOTE — Telephone Encounter (Signed)
Left voicemail to call back re: BMD results. 

## 2018-02-24 ENCOUNTER — Other Ambulatory Visit: Payer: Self-pay

## 2018-02-24 ENCOUNTER — Encounter: Payer: Self-pay | Admitting: Obstetrics & Gynecology

## 2018-02-24 ENCOUNTER — Ambulatory Visit (INDEPENDENT_AMBULATORY_CARE_PROVIDER_SITE_OTHER): Payer: BLUE CROSS/BLUE SHIELD | Admitting: Obstetrics & Gynecology

## 2018-02-24 VITALS — BP 138/70 | HR 68 | Resp 16 | Ht 64.75 in | Wt 142.6 lb

## 2018-02-24 DIAGNOSIS — Z01419 Encounter for gynecological examination (general) (routine) without abnormal findings: Secondary | ICD-10-CM

## 2018-02-24 MED ORDER — EPIPEN 2-PAK 0.3 MG/0.3ML IJ SOAJ: 0.3000 mg | Freq: Once | INTRAMUSCULAR | 2 refills | Status: AC
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
## 2018-02-24 NOTE — Patient Instructions (Signed)
Zoster Vaccine, Recombinant injection  What is this medicine?  ZOSTER VACCINE (ZOS ter vak SEEN) is used to prevent shingles in adults 63 years old and over. This vaccine is not used to treat shingles or nerve pain from shingles.  This medicine may be used for other purposes; ask your health care provider or pharmacist if you have questions.  COMMON BRAND NAME(S): SHINGRIX  What should I tell my health care provider before I take this medicine?  They need to know if you have any of these conditions:  -blood disorders or disease  -cancer like leukemia or lymphoma  -immune system problems or therapy  -an unusual or allergic reaction to vaccines, other medications, foods, dyes, or preservatives  -pregnant or trying to get pregnant  -breast-feeding  How should I use this medicine?  This vaccine is for injection in a muscle. It is given by a health care professional.  Talk to your pediatrician regarding the use of this medicine in children. This medicine is not approved for use in children.  Overdosage: If you think you have taken too much of this medicine contact a poison control center or emergency room at once.  NOTE: This medicine is only for you. Do not share this medicine with others.  What if I miss a dose?  Keep appointments for follow-up (booster) doses as directed. It is important not to miss your dose. Call your doctor or health care professional if you are unable to keep an appointment.  What may interact with this medicine?  -medicines that suppress your immune system  -medicines to treat cancer  -steroid medicines like prednisone or cortisone  This list may not describe all possible interactions. Give your health care provider a list of all the medicines, herbs, non-prescription drugs, or dietary supplements you use. Also tell them if you smoke, drink alcohol, or use illegal drugs. Some items may interact with your medicine.  What should I watch for while using this medicine?  Visit your doctor for regular  check ups.  This vaccine, like all vaccines, may not fully protect everyone.  What side effects may I notice from receiving this medicine?  Side effects that you should report to your doctor or health care professional as soon as possible:  -allergic reactions like skin rash, itching or hives, swelling of the face, lips, or tongue  -breathing problems  Side effects that usually do not require medical attention (report these to your doctor or health care professional if they continue or are bothersome):  -chills  -headache  -fever  -nausea, vomiting  -redness, warmth, pain, swelling or itching at site where injected  -tiredness  This list may not describe all possible side effects. Call your doctor for medical advice about side effects. You may report side effects to FDA at 1-800-FDA-1088.  Where should I keep my medicine?  This vaccine is only given in a clinic, pharmacy, doctor's office, or other health care setting and will not be stored at home.  NOTE: This sheet is a summary. It may not cover all possible information. If you have questions about this medicine, talk to your doctor, pharmacist, or health care provider.   2019 Elsevier/Gold Standard (2016-08-20 13:20:30)

## 2018-05-19 DIAGNOSIS — Z08 Encounter for follow-up examination after completed treatment for malignant neoplasm: Secondary | ICD-10-CM | POA: Diagnosis not present

## 2018-05-19 DIAGNOSIS — L239 Allergic contact dermatitis, unspecified cause: Secondary | ICD-10-CM | POA: Diagnosis not present

## 2018-05-19 DIAGNOSIS — L821 Other seborrheic keratosis: Secondary | ICD-10-CM | POA: Diagnosis not present

## 2018-05-19 DIAGNOSIS — L820 Inflamed seborrheic keratosis: Secondary | ICD-10-CM | POA: Diagnosis not present

## 2018-05-19 DIAGNOSIS — D485 Neoplasm of uncertain behavior of skin: Secondary | ICD-10-CM | POA: Diagnosis not present

## 2018-05-19 DIAGNOSIS — L570 Actinic keratosis: Secondary | ICD-10-CM | POA: Diagnosis not present

## 2018-05-19 DIAGNOSIS — L814 Other melanin hyperpigmentation: Secondary | ICD-10-CM | POA: Diagnosis not present

## 2018-06-20 DIAGNOSIS — Z209 Contact with and (suspected) exposure to unspecified communicable disease: Secondary | ICD-10-CM | POA: Diagnosis not present

## 2018-07-04 IMAGING — CT CT ABD-PELV W/ CM
1 of 4 series · 13 of 32 positions shown, 19 images · IV contrast (APPLIED)
Comparison: CT scan of May 03, 2015.

CLINICAL DATA: Intra-abdominal and abdominal swelling, mass and
lump.

EXAM:
CT ABDOMEN AND PELVIS WITH CONTRAST
TECHNIQUE: Multidetector CT imaging of the abdomen and pelvis was performed
using the standard protocol following bolus administration of
intravenous contrast.
CONTRAST:  100mL X5NQVJ-RSS IOPAMIDOL (X5NQVJ-RSS) INJECTION 61%

[Series 6: abd/pelvis w/cm · axial · 0.71mm/px · z∈[-442,-72]mm · 13 of 86 slices shown, 19 images]
[im 6/86  soft-tissue]
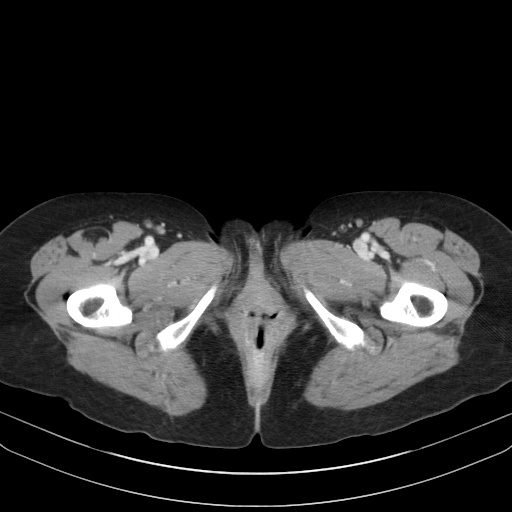
[im 6/86  bone]
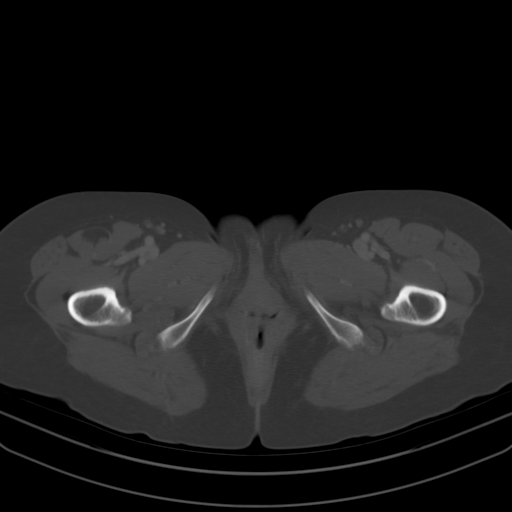
[im 12/86  soft-tissue]
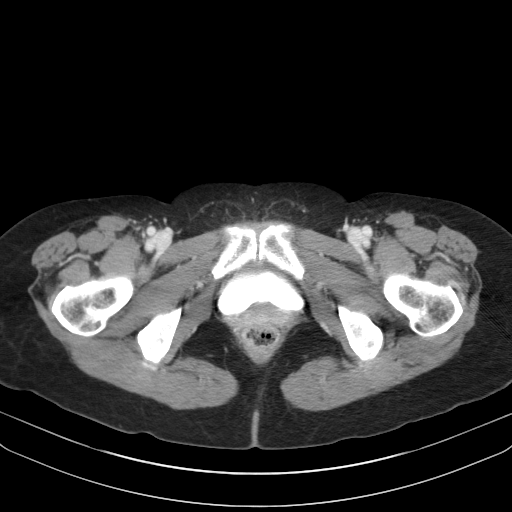
[im 18/86  soft-tissue]
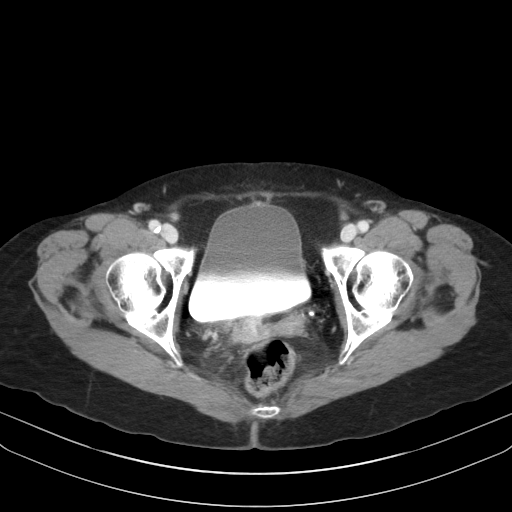
[im 23/86  soft-tissue]
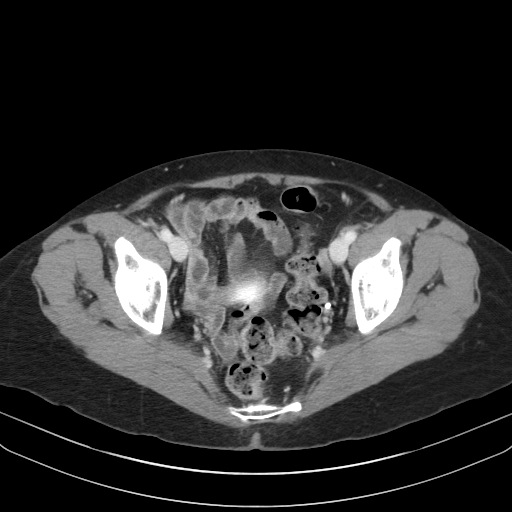
[im 29/86  soft-tissue]
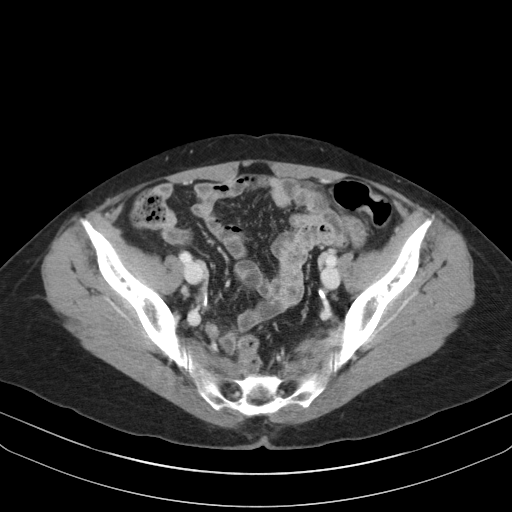
[im 35/86  soft-tissue]
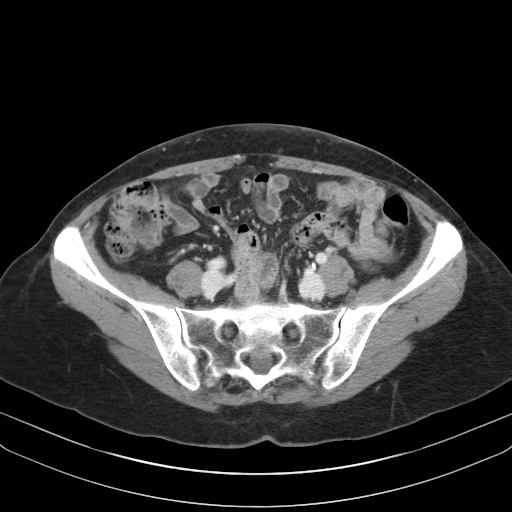
[im 46/86  soft-tissue]
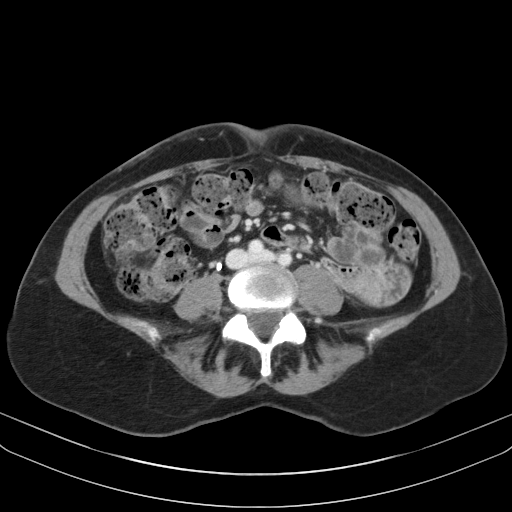
[im 52/86  soft-tissue]
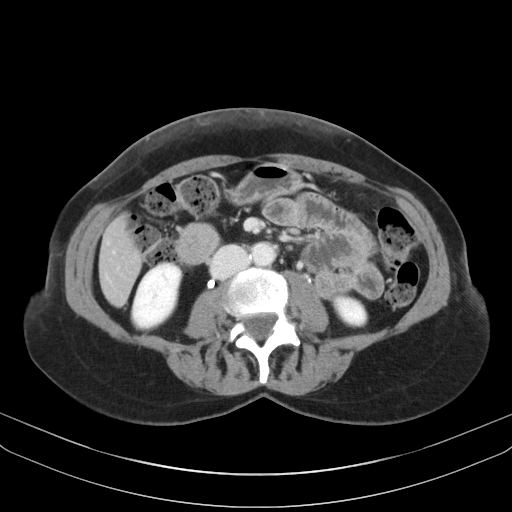
[im 57/86  soft-tissue]
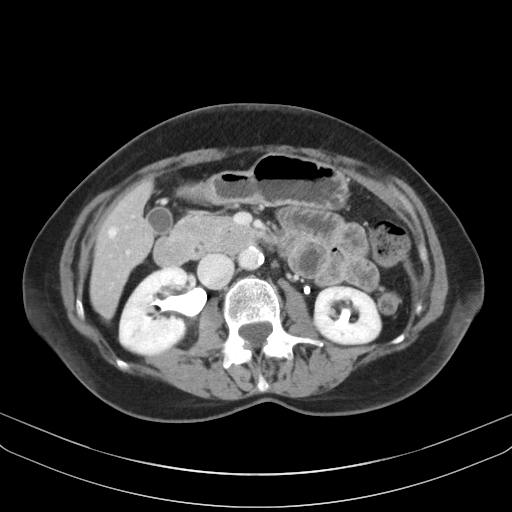
[im 57/86  bone]
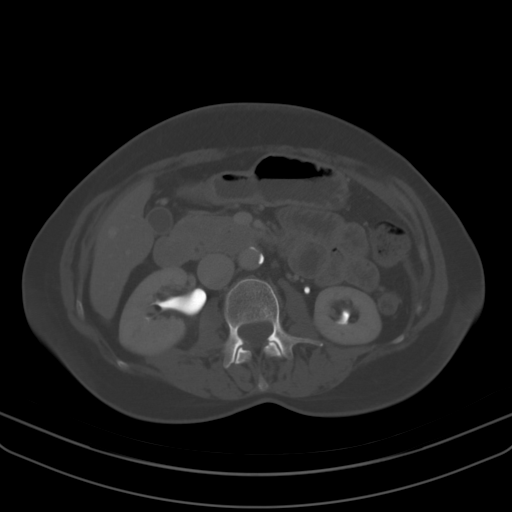
[im 63/86  soft-tissue]
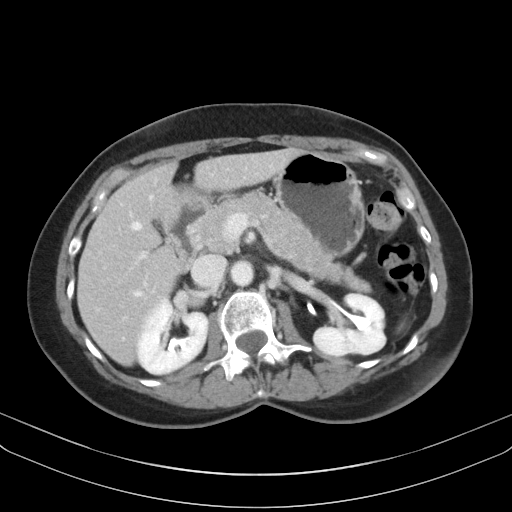
[im 63/86  lung]
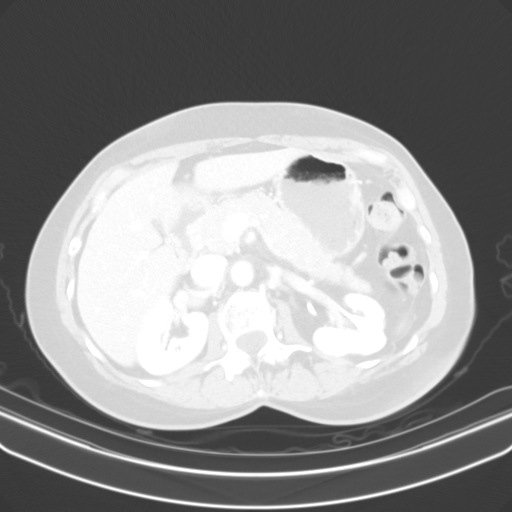
[im 69/86  soft-tissue]
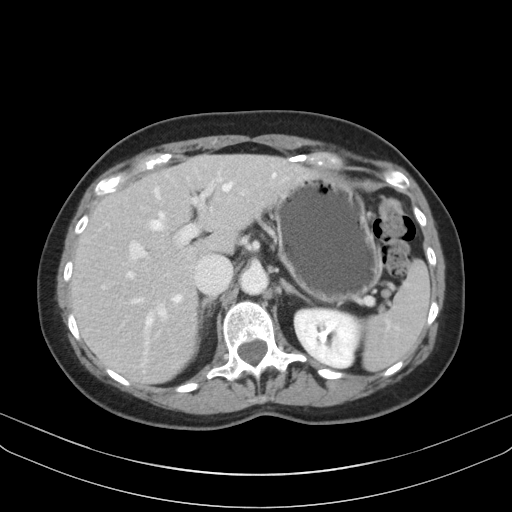
[im 69/86  lung]
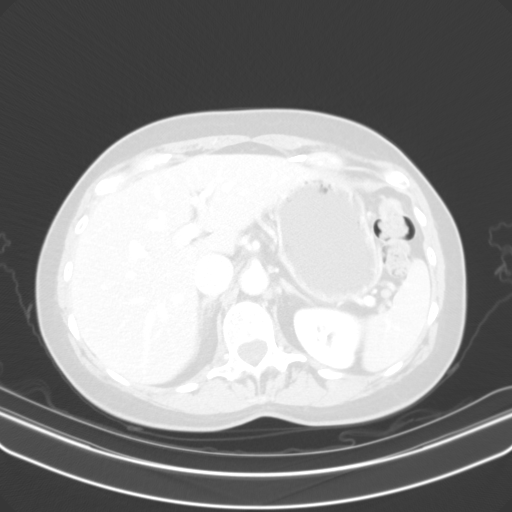
[im 74/86  soft-tissue]
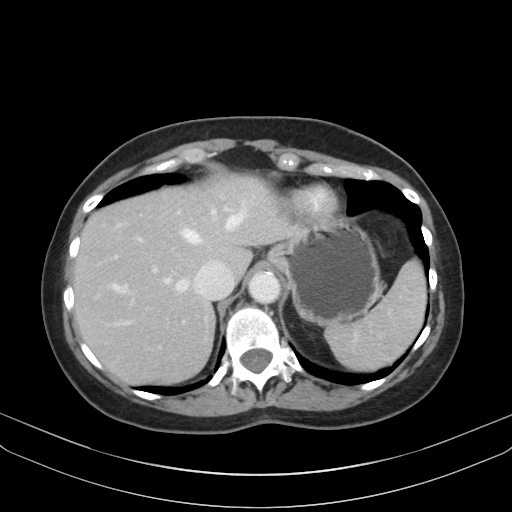
[im 74/86  lung]
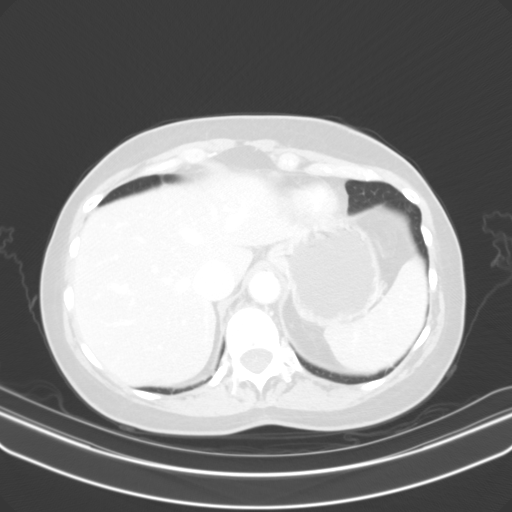
[im 80/86  soft-tissue]
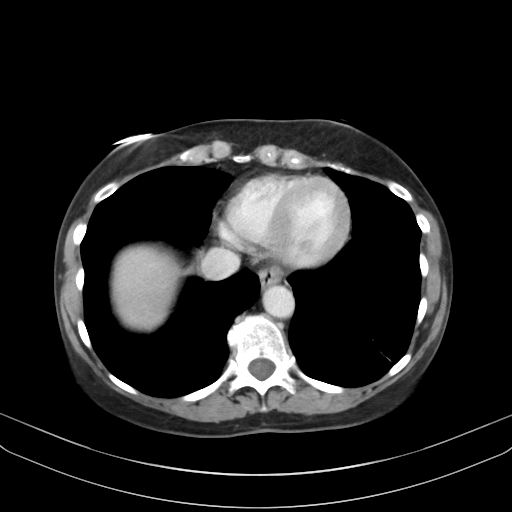
[im 80/86  lung]
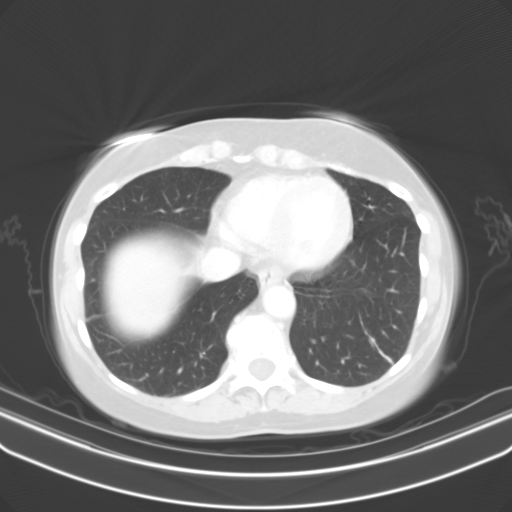

[13 of 32 positions shown; findings below may reference images not displayed]

FINDINGS: Lower chest: No acute abnormality.

Hepatobiliary: No focal liver abnormality is seen. No gallstones,
gallbladder wall thickening, or biliary dilatation.

Pancreas: Unremarkable. No pancreatic ductal dilatation or
surrounding inflammatory changes.

Spleen: Normal in size without focal abnormality.

Adrenals/Urinary Tract: Adrenal glands are unremarkable. Kidneys are
normal, without renal calculi, focal lesion, or hydronephrosis.
Bladder is unremarkable.

Stomach/Bowel: The stomach appears normal. There is no evidence of
bowel obstruction or inflammation. Status post appendectomy.

Vascular/Lymphatic: Aortic atherosclerosis. No enlarged abdominal or
pelvic lymph nodes.

Reproductive: Status post hysterectomy. No adnexal masses.

Other: No abdominal wall hernia or abnormality. No abdominopelvic
ascites.

Musculoskeletal: No acute or significant osseous findings.
IMPRESSION: No acute abnormality seen in the abdomen or pelvis.

Aortic Atherosclerosis ([id number]).

## 2018-10-09 DIAGNOSIS — Z872 Personal history of diseases of the skin and subcutaneous tissue: Secondary | ICD-10-CM | POA: Diagnosis not present

## 2018-10-09 DIAGNOSIS — D225 Melanocytic nevi of trunk: Secondary | ICD-10-CM | POA: Diagnosis not present

## 2018-10-09 DIAGNOSIS — L570 Actinic keratosis: Secondary | ICD-10-CM | POA: Diagnosis not present

## 2018-10-09 DIAGNOSIS — L578 Other skin changes due to chronic exposure to nonionizing radiation: Secondary | ICD-10-CM | POA: Diagnosis not present

## 2018-10-09 DIAGNOSIS — L821 Other seborrheic keratosis: Secondary | ICD-10-CM | POA: Diagnosis not present

## 2019-04-06 DIAGNOSIS — Z01818 Encounter for other preprocedural examination: Secondary | ICD-10-CM | POA: Diagnosis not present

## 2019-04-06 DIAGNOSIS — Z20822 Contact with and (suspected) exposure to covid-19: Secondary | ICD-10-CM | POA: Diagnosis not present

## 2019-04-08 DIAGNOSIS — Z1211 Encounter for screening for malignant neoplasm of colon: Secondary | ICD-10-CM | POA: Diagnosis not present

## 2019-04-08 DIAGNOSIS — D123 Benign neoplasm of transverse colon: Secondary | ICD-10-CM | POA: Diagnosis not present

## 2019-04-08 DIAGNOSIS — K640 First degree hemorrhoids: Secondary | ICD-10-CM | POA: Diagnosis not present

## 2019-04-29 DIAGNOSIS — L570 Actinic keratosis: Secondary | ICD-10-CM | POA: Diagnosis not present

## 2019-04-29 DIAGNOSIS — L578 Other skin changes due to chronic exposure to nonionizing radiation: Secondary | ICD-10-CM | POA: Diagnosis not present

## 2019-04-29 DIAGNOSIS — D2262 Melanocytic nevi of left upper limb, including shoulder: Secondary | ICD-10-CM | POA: Diagnosis not present

## 2019-04-29 DIAGNOSIS — L218 Other seborrheic dermatitis: Secondary | ICD-10-CM | POA: Diagnosis not present

## 2019-04-29 DIAGNOSIS — L821 Other seborrheic keratosis: Secondary | ICD-10-CM | POA: Diagnosis not present

## 2019-05-11 DIAGNOSIS — M19042 Primary osteoarthritis, left hand: Secondary | ICD-10-CM | POA: Diagnosis not present

## 2019-05-11 DIAGNOSIS — M19041 Primary osteoarthritis, right hand: Secondary | ICD-10-CM | POA: Diagnosis not present

## 2019-05-11 DIAGNOSIS — M720 Palmar fascial fibromatosis [Dupuytren]: Secondary | ICD-10-CM | POA: Diagnosis not present

## 2019-05-11 DIAGNOSIS — M1812 Unilateral primary osteoarthritis of first carpometacarpal joint, left hand: Secondary | ICD-10-CM | POA: Diagnosis not present

## 2019-06-26 DIAGNOSIS — Z8249 Family history of ischemic heart disease and other diseases of the circulatory system: Secondary | ICD-10-CM | POA: Diagnosis not present

## 2019-06-26 DIAGNOSIS — I10 Essential (primary) hypertension: Secondary | ICD-10-CM | POA: Diagnosis not present

## 2019-06-26 DIAGNOSIS — I5189 Other ill-defined heart diseases: Secondary | ICD-10-CM | POA: Diagnosis not present

## 2019-06-26 DIAGNOSIS — I77819 Aortic ectasia, unspecified site: Secondary | ICD-10-CM | POA: Diagnosis not present

## 2019-07-03 ENCOUNTER — Ambulatory Visit: Payer: BLUE CROSS/BLUE SHIELD | Admitting: Obstetrics & Gynecology

## 2019-07-23 DIAGNOSIS — I10 Essential (primary) hypertension: Secondary | ICD-10-CM | POA: Diagnosis not present

## 2019-08-05 DIAGNOSIS — D2361 Other benign neoplasm of skin of right upper limb, including shoulder: Secondary | ICD-10-CM | POA: Diagnosis not present

## 2019-08-05 DIAGNOSIS — D485 Neoplasm of uncertain behavior of skin: Secondary | ICD-10-CM | POA: Diagnosis not present

## 2019-08-05 DIAGNOSIS — L578 Other skin changes due to chronic exposure to nonionizing radiation: Secondary | ICD-10-CM | POA: Diagnosis not present

## 2019-08-21 DIAGNOSIS — S83419A Sprain of medial collateral ligament of unspecified knee, initial encounter: Secondary | ICD-10-CM | POA: Diagnosis not present

## 2019-08-21 DIAGNOSIS — S336XXA Sprain of sacroiliac joint, initial encounter: Secondary | ICD-10-CM | POA: Diagnosis not present

## 2019-09-24 DIAGNOSIS — M5136 Other intervertebral disc degeneration, lumbar region: Secondary | ICD-10-CM | POA: Diagnosis not present

## 2019-09-24 DIAGNOSIS — R102 Pelvic and perineal pain: Secondary | ICD-10-CM | POA: Diagnosis not present

## 2019-09-24 DIAGNOSIS — M5416 Radiculopathy, lumbar region: Secondary | ICD-10-CM | POA: Diagnosis not present

## 2019-09-24 DIAGNOSIS — M545 Low back pain: Secondary | ICD-10-CM | POA: Diagnosis not present

## 2019-10-14 DIAGNOSIS — M955 Acquired deformity of pelvis: Secondary | ICD-10-CM | POA: Diagnosis not present

## 2019-10-14 DIAGNOSIS — M5136 Other intervertebral disc degeneration, lumbar region: Secondary | ICD-10-CM | POA: Diagnosis not present

## 2019-10-14 DIAGNOSIS — M25551 Pain in right hip: Secondary | ICD-10-CM | POA: Diagnosis not present
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
## 2019-10-20 ENCOUNTER — Encounter: Payer: Self-pay | Admitting: Obstetrics & Gynecology

## 2019-10-20 ENCOUNTER — Other Ambulatory Visit: Payer: Self-pay

## 2019-10-20 ENCOUNTER — Ambulatory Visit (INDEPENDENT_AMBULATORY_CARE_PROVIDER_SITE_OTHER): Payer: BC Managed Care – PPO | Admitting: Obstetrics & Gynecology

## 2019-10-20 VITALS — BP 116/70 | HR 68 | Resp 16 | Ht 64.5 in | Wt 135.0 lb

## 2019-10-20 DIAGNOSIS — Z01419 Encounter for gynecological examination (general) (routine) without abnormal findings: Secondary | ICD-10-CM | POA: Diagnosis not present

## 2019-11-02 ENCOUNTER — Other Ambulatory Visit: Payer: Self-pay

## 2019-11-02 DIAGNOSIS — M955 Acquired deformity of pelvis: Secondary | ICD-10-CM | POA: Diagnosis not present

## 2019-11-02 DIAGNOSIS — M25551 Pain in right hip: Secondary | ICD-10-CM | POA: Diagnosis not present

## 2019-11-02 DIAGNOSIS — M5136 Other intervertebral disc degeneration, lumbar region: Secondary | ICD-10-CM | POA: Diagnosis not present

## 2019-11-02 NOTE — Telephone Encounter (Signed)
Patient calling in regards to refill for Epipen.

## 2019-11-02 NOTE — Telephone Encounter (Signed)
Spoke with patient. Patient is requesting a refill of Epipen 2 pack 0.3mg /0.59ml IM. Last filled by Dr. Hyacinth Meeker on 02/24/18, current Rx has expired. Patient has allergies to Bees and Archivist. No PCP.  Advised patient this is not a prescription typically filled by GYN. Advised Dr. Hyacinth Meeker is out of the office until 11/05/19, will send request to her to review when she returns. Pharmacy on file confirmed.  ER precautions reviewed. Patient verbalizes understanding and is agreeable.   Last AEX 10/20/19  Dr. Hyacinth Meeker -please advise on refill.

## 2019-11-04 DIAGNOSIS — M47816 Spondylosis without myelopathy or radiculopathy, lumbar region: Secondary | ICD-10-CM | POA: Diagnosis not present

## 2019-11-04 DIAGNOSIS — M545 Low back pain, unspecified: Secondary | ICD-10-CM | POA: Diagnosis not present

## 2019-11-04 DIAGNOSIS — M5136 Other intervertebral disc degeneration, lumbar region: Secondary | ICD-10-CM | POA: Diagnosis not present

## 2019-11-04 MED ORDER — EPINEPHRINE 0.3 MG/0.3ML IJ SOAJ: 0.3000 mg | INTRAMUSCULAR | 0 refills | Status: AC | PRN

## 2019-11-04 NOTE — Telephone Encounter (Signed)
Patient notified of refill.

## 2019-11-16 DIAGNOSIS — M955 Acquired deformity of pelvis: Secondary | ICD-10-CM | POA: Diagnosis not present

## 2019-11-16 DIAGNOSIS — M25551 Pain in right hip: Secondary | ICD-10-CM | POA: Diagnosis not present

## 2019-11-16 DIAGNOSIS — M5136 Other intervertebral disc degeneration, lumbar region: Secondary | ICD-10-CM | POA: Diagnosis not present

## 2019-11-16 DIAGNOSIS — M25561 Pain in right knee: Secondary | ICD-10-CM | POA: Diagnosis not present

## 2019-11-23 DIAGNOSIS — M25561 Pain in right knee: Secondary | ICD-10-CM | POA: Diagnosis not present

## 2019-11-23 DIAGNOSIS — M955 Acquired deformity of pelvis: Secondary | ICD-10-CM | POA: Diagnosis not present

## 2019-11-23 DIAGNOSIS — M25551 Pain in right hip: Secondary | ICD-10-CM | POA: Diagnosis not present

## 2019-11-23 DIAGNOSIS — M5136 Other intervertebral disc degeneration, lumbar region: Secondary | ICD-10-CM | POA: Diagnosis not present

## 2019-11-30 DIAGNOSIS — M5136 Other intervertebral disc degeneration, lumbar region: Secondary | ICD-10-CM | POA: Diagnosis not present

## 2019-11-30 DIAGNOSIS — M25551 Pain in right hip: Secondary | ICD-10-CM | POA: Diagnosis not present

## 2019-11-30 DIAGNOSIS — M25561 Pain in right knee: Secondary | ICD-10-CM | POA: Diagnosis not present

## 2019-11-30 DIAGNOSIS — M955 Acquired deformity of pelvis: Secondary | ICD-10-CM | POA: Diagnosis not present

## 2019-12-07 DIAGNOSIS — M955 Acquired deformity of pelvis: Secondary | ICD-10-CM | POA: Diagnosis not present

## 2019-12-07 DIAGNOSIS — M25561 Pain in right knee: Secondary | ICD-10-CM | POA: Diagnosis not present

## 2019-12-07 DIAGNOSIS — M25551 Pain in right hip: Secondary | ICD-10-CM | POA: Diagnosis not present

## 2019-12-07 DIAGNOSIS — M5136 Other intervertebral disc degeneration, lumbar region: Secondary | ICD-10-CM | POA: Diagnosis not present

## 2019-12-28 DIAGNOSIS — M25551 Pain in right hip: Secondary | ICD-10-CM | POA: Diagnosis not present

## 2019-12-28 DIAGNOSIS — M5136 Other intervertebral disc degeneration, lumbar region: Secondary | ICD-10-CM | POA: Diagnosis not present

## 2019-12-28 DIAGNOSIS — M955 Acquired deformity of pelvis: Secondary | ICD-10-CM | POA: Diagnosis not present

## 2020-01-11 DIAGNOSIS — M5136 Other intervertebral disc degeneration, lumbar region: Secondary | ICD-10-CM | POA: Diagnosis not present

## 2020-01-11 DIAGNOSIS — M955 Acquired deformity of pelvis: Secondary | ICD-10-CM | POA: Diagnosis not present

## 2020-01-11 DIAGNOSIS — M25561 Pain in right knee: Secondary | ICD-10-CM | POA: Diagnosis not present

## 2020-01-11 DIAGNOSIS — M25551 Pain in right hip: Secondary | ICD-10-CM | POA: Diagnosis not present
